# Patient Record
Sex: Male | Born: 1975 | Race: White | Hispanic: No | Marital: Married | Smoking: Current every day smoker
Health system: Southern US, Community
[De-identification: ages and names within clinical notes are randomized; demographics above are authoritative.]

## PROBLEM LIST (undated history)

## (undated) DIAGNOSIS — F419 Anxiety disorder, unspecified: Secondary | ICD-10-CM

## (undated) DIAGNOSIS — F1010 Alcohol abuse, uncomplicated: Secondary | ICD-10-CM

## (undated) DIAGNOSIS — Z87898 Personal history of other specified conditions: Secondary | ICD-10-CM

## (undated) DIAGNOSIS — I10 Essential (primary) hypertension: Secondary | ICD-10-CM

## (undated) HISTORY — PX: HERNIA REPAIR: SHX51

---

## 2018-05-15 DEATH — deceased

## 2019-04-12 ENCOUNTER — Other Ambulatory Visit: Payer: Self-pay | Admitting: *Deleted

## 2019-04-12 DIAGNOSIS — Z20822 Contact with and (suspected) exposure to covid-19: Secondary | ICD-10-CM

## 2019-04-13 ENCOUNTER — Telehealth: Payer: Self-pay

## 2019-04-13 LAB — NOVEL CORONAVIRUS, NAA: SARS-CoV-2, NAA: DETECTED — AB

## 2019-04-13 NOTE — Telephone Encounter (Addendum)
Patient called in requesting MyChart setup assistance and COVID19 lab results - DOB/Address verified  - assisted w/MyChart setup and Positive results given.   Patient reports having diarrhea  - x6 today; a dry cough,fever; runny nose - clear and sore throat. Patient reports taking OTC Ibuprofen 200 mg  x2 for fever. Recommended OTC Immodium, Kaopectate or Pepto Bismol - as directed for diarrhea; drinking warm fluids for dry cough and sore throat.  Reviewed Positive protocol with patient and advised to notify PCP of positive results, no further questions.

## 2019-04-18 ENCOUNTER — Encounter (HOSPITAL_COMMUNITY): Payer: Self-pay | Admitting: Emergency Medicine

## 2019-04-18 ENCOUNTER — Emergency Department (HOSPITAL_COMMUNITY)
Admission: EM | Admit: 2019-04-18 | Discharge: 2019-04-19 | Disposition: A | Discharge status: Home / Self Care | Payer: Self-pay | Attending: Emergency Medicine | Admitting: Emergency Medicine

## 2019-04-18 ENCOUNTER — Emergency Department (HOSPITAL_COMMUNITY): Payer: Self-pay

## 2019-04-18 ENCOUNTER — Other Ambulatory Visit: Payer: Self-pay

## 2019-04-18 DIAGNOSIS — Y939 Activity, unspecified: Secondary | ICD-10-CM | POA: Insufficient documentation

## 2019-04-18 DIAGNOSIS — Y929 Unspecified place or not applicable: Secondary | ICD-10-CM | POA: Insufficient documentation

## 2019-04-18 DIAGNOSIS — Y999 Unspecified external cause status: Secondary | ICD-10-CM | POA: Insufficient documentation

## 2019-04-18 DIAGNOSIS — F17210 Nicotine dependence, cigarettes, uncomplicated: Secondary | ICD-10-CM | POA: Insufficient documentation

## 2019-04-18 DIAGNOSIS — S01111A Laceration without foreign body of right eyelid and periocular area, initial encounter: Secondary | ICD-10-CM | POA: Insufficient documentation

## 2019-04-18 MED ORDER — ACETAMINOPHEN 325 MG PO TABS
650.0000 mg | ORAL_TABLET | Freq: Once | ORAL | Status: AC
  Administered 2019-04-18: 650 mg via ORAL
  Filled 2019-04-18: qty 2
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
## 2019-04-18 NOTE — ED Notes (Signed)
Patient transported to CT 

## 2019-04-19 MED ORDER — IBUPROFEN 800 MG PO TABS: 800.0000 mg | ORAL_TABLET | Freq: Three times a day (TID) | ORAL | 0 refills | Status: AC
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
## 2019-04-19 NOTE — Discharge Instructions (Signed)
Apply ice packs on and off to your eye to help reduce bruising and swelling.  The Dermabond used to close the laceration will begin to peel off in a week or so.  Allow this to come off naturally.  Return to the ER for any worsening symptoms.  You may follow-up with the eye doctor listed if needed.
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
## 2023-10-25 ENCOUNTER — Encounter: Payer: Self-pay | Admitting: Internal Medicine

## 2023-11-24 ENCOUNTER — Emergency Department (HOSPITAL_COMMUNITY)

## 2023-11-24 ENCOUNTER — Encounter (HOSPITAL_COMMUNITY): Payer: Self-pay

## 2023-11-24 ENCOUNTER — Telehealth: Payer: Self-pay | Admitting: *Deleted

## 2023-11-24 ENCOUNTER — Inpatient Hospital Stay (HOSPITAL_COMMUNITY)
Admission: EM | Admit: 2023-11-24 | Discharge: 2023-11-27 | DRG: 418 | Disposition: A | Discharge status: Home / Self Care | Source: Ambulatory Visit | Attending: Internal Medicine | Admitting: Internal Medicine

## 2023-11-24 ENCOUNTER — Encounter: Payer: Self-pay | Admitting: Internal Medicine

## 2023-11-24 ENCOUNTER — Other Ambulatory Visit: Payer: Self-pay

## 2023-11-24 ENCOUNTER — Ambulatory Visit (INDEPENDENT_AMBULATORY_CARE_PROVIDER_SITE_OTHER): Admitting: Internal Medicine

## 2023-11-24 VITALS — BP 157/103 | HR 83 | Temp 97.8°F | Ht 72.0 in | Wt 194.3 lb

## 2023-11-24 DIAGNOSIS — R112 Nausea with vomiting, unspecified: Secondary | ICD-10-CM | POA: Diagnosis not present

## 2023-11-24 DIAGNOSIS — K810 Acute cholecystitis: Secondary | ICD-10-CM

## 2023-11-24 DIAGNOSIS — F10939 Alcohol use, unspecified with withdrawal, unspecified: Secondary | ICD-10-CM | POA: Insufficient documentation

## 2023-11-24 DIAGNOSIS — R630 Anorexia: Secondary | ICD-10-CM | POA: Diagnosis not present

## 2023-11-24 DIAGNOSIS — K824 Cholesterolosis of gallbladder: Secondary | ICD-10-CM | POA: Diagnosis not present

## 2023-11-24 DIAGNOSIS — I10 Essential (primary) hypertension: Secondary | ICD-10-CM | POA: Diagnosis present

## 2023-11-24 DIAGNOSIS — F1010 Alcohol abuse, uncomplicated: Secondary | ICD-10-CM | POA: Insufficient documentation

## 2023-11-24 DIAGNOSIS — K811 Chronic cholecystitis: Secondary | ICD-10-CM | POA: Diagnosis present

## 2023-11-24 DIAGNOSIS — K660 Peritoneal adhesions (postprocedural) (postinfection): Secondary | ICD-10-CM | POA: Diagnosis present

## 2023-11-24 DIAGNOSIS — F10239 Alcohol dependence with withdrawal, unspecified: Secondary | ICD-10-CM | POA: Diagnosis present

## 2023-11-24 DIAGNOSIS — K7581 Nonalcoholic steatohepatitis (NASH): Secondary | ICD-10-CM | POA: Diagnosis present

## 2023-11-24 DIAGNOSIS — R1033 Periumbilical pain: Secondary | ICD-10-CM | POA: Diagnosis not present

## 2023-11-24 DIAGNOSIS — F1029 Alcohol dependence with unspecified alcohol-induced disorder: Secondary | ICD-10-CM

## 2023-11-24 DIAGNOSIS — R1114 Bilious vomiting: Secondary | ICD-10-CM

## 2023-11-24 DIAGNOSIS — F418 Other specified anxiety disorders: Secondary | ICD-10-CM | POA: Diagnosis not present

## 2023-11-24 DIAGNOSIS — F10151 Alcohol abuse with alcohol-induced psychotic disorder with hallucinations: Secondary | ICD-10-CM | POA: Diagnosis not present

## 2023-11-24 DIAGNOSIS — R1011 Right upper quadrant pain: Secondary | ICD-10-CM

## 2023-11-24 DIAGNOSIS — R1110 Vomiting, unspecified: Secondary | ICD-10-CM | POA: Diagnosis not present

## 2023-11-24 DIAGNOSIS — F419 Anxiety disorder, unspecified: Secondary | ICD-10-CM | POA: Diagnosis present

## 2023-11-24 DIAGNOSIS — Z79899 Other long term (current) drug therapy: Secondary | ICD-10-CM | POA: Diagnosis not present

## 2023-11-24 DIAGNOSIS — K4090 Unilateral inguinal hernia, without obstruction or gangrene, not specified as recurrent: Secondary | ICD-10-CM | POA: Diagnosis present

## 2023-11-24 DIAGNOSIS — F32A Depression, unspecified: Secondary | ICD-10-CM | POA: Diagnosis present

## 2023-11-24 DIAGNOSIS — K760 Fatty (change of) liver, not elsewhere classified: Secondary | ICD-10-CM

## 2023-11-24 DIAGNOSIS — F17210 Nicotine dependence, cigarettes, uncomplicated: Secondary | ICD-10-CM | POA: Diagnosis present

## 2023-11-24 DIAGNOSIS — R7401 Elevation of levels of liver transaminase levels: Secondary | ICD-10-CM | POA: Diagnosis not present

## 2023-11-24 DIAGNOSIS — R7989 Other specified abnormal findings of blood chemistry: Secondary | ICD-10-CM | POA: Diagnosis not present

## 2023-11-24 DIAGNOSIS — R410 Disorientation, unspecified: Secondary | ICD-10-CM | POA: Diagnosis not present

## 2023-11-24 DIAGNOSIS — F10139 Alcohol abuse with withdrawal, unspecified: Secondary | ICD-10-CM | POA: Diagnosis not present

## 2023-11-24 DIAGNOSIS — F10930 Alcohol use, unspecified with withdrawal, uncomplicated: Secondary | ICD-10-CM | POA: Diagnosis not present

## 2023-11-24 DIAGNOSIS — K420 Umbilical hernia with obstruction, without gangrene: Secondary | ICD-10-CM | POA: Diagnosis present

## 2023-11-24 DIAGNOSIS — G8929 Other chronic pain: Secondary | ICD-10-CM | POA: Diagnosis present

## 2023-11-24 DIAGNOSIS — F1120 Opioid dependence, uncomplicated: Secondary | ICD-10-CM | POA: Diagnosis present

## 2023-11-24 DIAGNOSIS — G8918 Other acute postprocedural pain: Secondary | ICD-10-CM | POA: Diagnosis present

## 2023-11-24 DIAGNOSIS — F1090 Alcohol use, unspecified, uncomplicated: Secondary | ICD-10-CM

## 2023-11-24 DIAGNOSIS — E46 Unspecified protein-calorie malnutrition: Secondary | ICD-10-CM | POA: Diagnosis not present

## 2023-11-24 DIAGNOSIS — K429 Umbilical hernia without obstruction or gangrene: Secondary | ICD-10-CM

## 2023-11-24 DIAGNOSIS — K439 Ventral hernia without obstruction or gangrene: Principal | ICD-10-CM

## 2023-11-24 DIAGNOSIS — E8809 Other disorders of plasma-protein metabolism, not elsewhere classified: Secondary | ICD-10-CM | POA: Diagnosis not present

## 2023-11-24 DIAGNOSIS — Z720 Tobacco use: Secondary | ICD-10-CM | POA: Diagnosis not present

## 2023-11-24 HISTORY — DX: Personal history of other specified conditions: Z87.898

## 2023-11-24 HISTORY — DX: Essential (primary) hypertension: I10

## 2023-11-24 HISTORY — DX: Anxiety disorder, unspecified: F41.9

## 2023-11-24 HISTORY — DX: Alcohol abuse, uncomplicated: F10.10

## 2023-11-24 LAB — URINALYSIS, ROUTINE W REFLEX MICROSCOPIC
Bacteria, UA: NONE SEEN
Bilirubin Urine: NEGATIVE
Glucose, UA: 50 mg/dL — AB
Hgb urine dipstick: NEGATIVE
Ketones, ur: NEGATIVE mg/dL
Leukocytes,Ua: NEGATIVE
Nitrite: NEGATIVE
Protein, ur: 30 mg/dL — AB
Specific Gravity, Urine: 1.02 (ref 1.005–1.030)
pH: 6 (ref 5.0–8.0)

## 2023-11-24 LAB — COMPREHENSIVE METABOLIC PANEL WITH GFR
ALT: 98 U/L — ABNORMAL HIGH (ref 0–44)
AST: 120 U/L — ABNORMAL HIGH (ref 15–41)
Albumin: 3.2 g/dL — ABNORMAL LOW (ref 3.5–5.0)
Alkaline Phosphatase: 102 U/L (ref 38–126)
Anion gap: 12 (ref 5–15)
BUN: 6 mg/dL (ref 6–20)
CO2: 23 mmol/L (ref 22–32)
Calcium: 8.5 mg/dL — ABNORMAL LOW (ref 8.9–10.3)
Chloride: 103 mmol/L (ref 98–111)
Creatinine, Ser: 0.79 mg/dL (ref 0.61–1.24)
GFR, Estimated: >60 mL/min (ref >60)
Glucose, Bld: 145 mg/dL — ABNORMAL HIGH (ref 70–99)
Potassium: 3.7 mmol/L (ref 3.5–5.1)
Sodium: 138 mmol/L (ref 135–145)
Total Bilirubin: 0.5 mg/dL (ref 0.0–1.2)
Total Protein: 6.4 g/dL — ABNORMAL LOW (ref 6.5–8.1)

## 2023-11-24 LAB — CBC
HCT: 43 % (ref 39.0–52.0)
Hemoglobin: 14.7 g/dL (ref 13.0–17.0)
MCH: 31.2 pg (ref 26.0–34.0)
MCHC: 34.2 g/dL (ref 30.0–36.0)
MCV: 91.3 fL (ref 80.0–100.0)
Platelets: 315 10*3/uL (ref 150–400)
RBC: 4.71 MIL/uL (ref 4.22–5.81)
RDW: 12.2 % (ref 11.5–15.5)
WBC: 4.5 10*3/uL (ref 4.0–10.5)
nRBC: 0 % (ref 0.0–0.2)

## 2023-11-24 LAB — PROTIME-INR
INR: 0.9 (ref 0.8–1.2)
Prothrombin Time: 12.6 s (ref 11.4–15.2)

## 2023-11-24 LAB — LIPASE, BLOOD: Lipase: 42 U/L (ref 11–51)

## 2023-11-24 LAB — SURGICAL PCR SCREEN
MRSA, PCR: NEGATIVE
Staphylococcus aureus: NEGATIVE

## 2023-11-24 MED ORDER — ONDANSETRON HCL 4 MG/2ML IJ SOLN: 4.0000 mg | Freq: Four times a day (QID) | INTRAMUSCULAR | Status: DC | PRN

## 2023-11-24 MED ORDER — SODIUM CHLORIDE 0.9 % IV SOLN
3.0000 g | Freq: Once | INTRAVENOUS | Status: AC
  Administered 2023-11-24: 3 g via INTRAVENOUS
  Filled 2023-11-24: qty 8

## 2023-11-24 MED ORDER — IOHEXOL 300 MG/ML  SOLN
100.0000 mL | Freq: Once | INTRAMUSCULAR | Status: AC | PRN
  Administered 2023-11-24: 100 mL via INTRAVENOUS

## 2023-11-24 MED ORDER — CHLORHEXIDINE GLUCONATE CLOTH 2 % EX PADS: 6.0000 | MEDICATED_PAD | Freq: Once | CUTANEOUS | Status: AC

## 2023-11-24 MED ORDER — FOLIC ACID 1 MG PO TABS
1.0000 mg | ORAL_TABLET | Freq: Every day | ORAL | Status: DC
  Administered 2023-11-24 – 2023-11-25 (×2): 1 mg via ORAL
  Filled 2023-11-24 (×4): qty 1

## 2023-11-24 MED ORDER — THIAMINE HCL 100 MG/ML IJ SOLN
100.0000 mg | Freq: Every day | INTRAMUSCULAR | Status: DC
  Administered 2023-11-24: 100 mg via INTRAVENOUS
  Filled 2023-11-24: qty 2

## 2023-11-24 MED ORDER — BUSPIRONE HCL 5 MG PO TABS
10.0000 mg | ORAL_TABLET | Freq: Three times a day (TID) | ORAL | Status: DC
  Administered 2023-11-24 – 2023-11-25 (×5): 10 mg via ORAL
  Filled 2023-11-24 (×9): qty 2

## 2023-11-24 MED ORDER — CHLORHEXIDINE GLUCONATE CLOTH 2 % EX PADS
6.0000 | MEDICATED_PAD | Freq: Once | CUTANEOUS | Status: AC
  Administered 2023-11-24: 6 via TOPICAL

## 2023-11-24 MED ORDER — LORAZEPAM 1 MG PO TABS
1.0000 mg | ORAL_TABLET | ORAL | Status: AC | PRN
  Administered 2023-11-24 (×2): 1 mg via ORAL
  Administered 2023-11-25: 2 mg via ORAL
  Filled 2023-11-24: qty 1
  Filled 2023-11-24: qty 2
  Filled 2023-11-24 (×2): qty 1

## 2023-11-24 MED ORDER — HEPARIN SODIUM (PORCINE) 5000 UNIT/ML IJ SOLN
5000.0000 [IU] | Freq: Three times a day (TID) | INTRAMUSCULAR | Status: DC
  Administered 2023-11-24 – 2023-11-25 (×3): 5000 [IU] via SUBCUTANEOUS
  Filled 2023-11-24 (×7): qty 1

## 2023-11-24 MED ORDER — BUPRENORPHINE HCL 8 MG SL SUBL
8.0000 mg | SUBLINGUAL_TABLET | Freq: Every day | SUBLINGUAL | Status: DC
  Administered 2023-11-24 – 2023-11-25 (×2): 8 mg via SUBLINGUAL
  Filled 2023-11-24: qty 4
  Filled 2023-11-24 (×2): qty 1
  Filled 2023-11-24: qty 4

## 2023-11-24 MED ORDER — GABAPENTIN 300 MG PO CAPS
300.0000 mg | ORAL_CAPSULE | Freq: Two times a day (BID) | ORAL | Status: DC
  Administered 2023-11-24 – 2023-11-25 (×3): 300 mg via ORAL
  Filled 2023-11-24 (×6): qty 1

## 2023-11-24 MED ORDER — POTASSIUM CHLORIDE IN NACL 20-0.9 MEQ/L-% IV SOLN: INTRAVENOUS | Status: AC

## 2023-11-24 MED ORDER — DIVALPROEX SODIUM 250 MG PO DR TAB
500.0000 mg | DELAYED_RELEASE_TABLET | Freq: Every day | ORAL | Status: DC
  Filled 2023-11-24 (×2): qty 2

## 2023-11-24 MED ORDER — LORAZEPAM 2 MG/ML IJ SOLN
1.0000 mg | INTRAMUSCULAR | Status: AC | PRN
  Administered 2023-11-24: 1 mg via INTRAVENOUS
  Administered 2023-11-25 (×4): 2 mg via INTRAVENOUS
  Administered 2023-11-25: 1 mg via INTRAVENOUS
  Filled 2023-11-24 (×7): qty 1

## 2023-11-24 MED ORDER — CLONIDINE HCL 0.1 MG PO TABS
0.1000 mg | ORAL_TABLET | Freq: Every day | ORAL | Status: DC
  Administered 2023-11-24 – 2023-11-25 (×2): 0.1 mg via ORAL
  Filled 2023-11-24 (×4): qty 1

## 2023-11-24 MED ORDER — FENTANYL CITRATE PF 50 MCG/ML IJ SOSY
50.0000 ug | PREFILLED_SYRINGE | Freq: Once | INTRAMUSCULAR | Status: AC
  Administered 2023-11-24: 50 ug via INTRAVENOUS
  Filled 2023-11-24: qty 1

## 2023-11-24 MED ORDER — HYDROXYZINE HCL 25 MG PO TABS
25.0000 mg | ORAL_TABLET | Freq: Three times a day (TID) | ORAL | Status: DC | PRN
  Administered 2023-11-24 – 2023-11-25 (×2): 25 mg via ORAL
  Filled 2023-11-24 (×3): qty 1

## 2023-11-24 MED ORDER — INDOCYANINE GREEN 25 MG IV SOLR
2.5000 mg | Freq: Once | INTRAVENOUS | Status: AC
  Administered 2023-11-25: 2.5 mg via INTRAVENOUS
  Filled 2023-11-24: qty 10

## 2023-11-24 MED ORDER — ONDANSETRON HCL 4 MG/2ML IJ SOLN
4.0000 mg | Freq: Once | INTRAMUSCULAR | Status: AC | PRN
  Administered 2023-11-24: 4 mg via INTRAVENOUS
  Filled 2023-11-24: qty 2

## 2023-11-24 MED ORDER — PANTOPRAZOLE SODIUM 40 MG PO TBEC
80.0000 mg | DELAYED_RELEASE_TABLET | Freq: Every day | ORAL | Status: DC
  Filled 2023-11-24: qty 2

## 2023-11-24 MED ORDER — FENTANYL CITRATE PF 50 MCG/ML IJ SOSY
50.0000 ug | PREFILLED_SYRINGE | INTRAMUSCULAR | Status: DC | PRN
  Administered 2023-11-24 – 2023-11-25 (×2): 50 ug via INTRAMUSCULAR
  Filled 2023-11-24 (×3): qty 1

## 2023-11-24 MED ORDER — THIAMINE MONONITRATE 100 MG PO TABS
100.0000 mg | ORAL_TABLET | Freq: Every day | ORAL | Status: DC
  Administered 2023-11-25: 100 mg via ORAL
  Filled 2023-11-24 (×3): qty 1

## 2023-11-24 MED ORDER — ONDANSETRON HCL 4 MG PO TABS: 4.0000 mg | ORAL_TABLET | Freq: Four times a day (QID) | ORAL | Status: DC | PRN

## 2023-11-24 MED ORDER — LACTATED RINGERS IV BOLUS
1000.0000 mL | Freq: Once | INTRAVENOUS | Status: AC
  Administered 2023-11-24: 1000 mL via INTRAVENOUS

## 2023-11-24 MED ORDER — SODIUM CHLORIDE 0.9 % IV SOLN
2.0000 g | INTRAVENOUS | Status: AC
  Administered 2023-11-25: 2 g via INTRAVENOUS
  Filled 2023-11-24 (×2): qty 2

## 2023-11-24 MED ORDER — MUPIROCIN 2 % EX OINT
1.0000 | TOPICAL_OINTMENT | Freq: Two times a day (BID) | CUTANEOUS | Status: DC
  Filled 2023-11-24: qty 22

## 2023-11-24 MED ORDER — ADULT MULTIVITAMIN W/MINERALS CH
1.0000 | ORAL_TABLET | Freq: Every day | ORAL | Status: DC
  Administered 2023-11-24 – 2023-11-25 (×2): 1 via ORAL
  Filled 2023-11-24 (×5): qty 1

## 2023-11-24 NOTE — ED Notes (Signed)
Pt reminded of urine sample needed.

## 2023-11-24 NOTE — ED Triage Notes (Signed)
 Pt arrived POV with c/o abd pain, nausea and decreased appetite for awhile. Pt went to GI Dr and he sent pt over to ED for evaluation and possible gallbladder removal.

## 2023-11-24 NOTE — H&P (View-Only) (Signed)
 [facility] Consult  Reason for Consult: RUQ pain, fatty liver  disease  Referring Physician: Dr. Mordechai April, Pearletha Bouche, PA   Chief Complaint   Abdominal Pain; Nausea     HPI: Eddie Bailey is a 48 y.o. male with chronic RUQ pain that has been going on for 3 months with an extensive workup at [facility] with CT and US  and HIDA. He had been noted to have a distended gallbladder, Murphy's sign but no obvious stones, normal gallbladder function, and fatty liver. The CT demonstrated no concern for hernia.   He was seen by Dr. Mordechai April today and told to go to the ED due to how much pain he was having in the RUQ.   The patient has a history of opioid abuse and is seen by Dr. Ambrosio Junker in [place] and takes buprenorphine/naloxone sublinguinal medication everyday for this in addition to clonidine, Depakote, and atarax for anxiety he reports. He has not been taking all of his medication due to his nausea/vomiting. He has been taking his buprenorphine/ naloxone sublinguinal.  He is worried about taking any opioids in the hospital.  He says his pain has been constant in the RUQ. He was worked up in the ED with gallbladder polyp on US , distended gallbladder, fatty liver, and CBD that was 5mm. He had elevated AST/ALT and normal bilirubin.   I was asked to see him about cholecystectomy.   Past Medical History:  Diagnosis Date   Alcohol abuse    10 years   Anxiety    History of substance use    Hypertension     Past Surgical History:  Procedure Laterality Date   HERNIA REPAIR     inguinal    History reviewed. No pertinent family history.  Social History   Tobacco Use   Smoking status: Every Day    Current packs/day: 1.00    Types: Cigarettes   Smokeless tobacco: Never  Vaping Use   Vaping status: Never Used  Substance Use Topics   Alcohol use: Yes    Alcohol/week: 12.0 standard drinks of alcohol    Types: 12 Cans of beer per week    Comment: daily   Drug use:  Not Currently    Medications: I have reviewed the patient's current medications. Current Facility-Administered Medications  Medication Dose Route Frequency Provider Last Rate Last Admin   Ampicillin-Sulbactam (UNASYN) 3 g in sodium chloride 0.9 % 100 mL IVPB  3 g Intravenous Once Rigney, Christopher D, PA-C       folic acid (FOLVITE) tablet 1 mg  1 mg Oral Daily Rigney, Christopher D, PA-C       LORazepam (ATIVAN) tablet 1-4 mg  1-4 mg Oral Q1H PRN Roselynn Connors, PA-C       Or   LORazepam (ATIVAN) injection 1-4 mg  1-4 mg Intravenous Q1H PRN Pearletha Bouche D, PA-C       multivitamin with minerals tablet 1 tablet  1 tablet Oral Daily Rigney, Christopher D, PA-C       thiamine (VITAMIN B1) tablet 100 mg  100 mg Oral Daily Rigney, Christopher D, PA-C       Or   thiamine (VITAMIN B1) injection 100 mg  100 mg Intravenous Daily Roselynn Connors, PA-C       Current Outpatient Medications  Medication Sig Dispense Refill Last Dose/Taking   Buprenorphine HCl-Naloxone HCl 8-2 MG FILM Place 1 Film under the tongue daily at 6 (six) AM.   11/24/2023 Morning   busPIRone (  BUSPAR) 10 MG tablet Take 10 mg by mouth 3 (three) times daily.   Past Month   cloNIDine (CATAPRES) 0.1 MG tablet Take 0.1 mg by mouth daily.   11/23/2023 Morning   divalproex (DEPAKOTE) 500 MG DR tablet Take 500 mg by mouth daily at 6 (six) AM.   Past Month   esomeprazole (NEXIUM) 40 MG capsule Take 40 mg by mouth every morning.   Past Month   famotidine (PEPCID) 40 MG tablet Take 40 mg by mouth daily.   Past Month   gabapentin (NEURONTIN) 300 MG capsule Take 300 mg by mouth 2 (two) times daily.   Past Month   hydrOXYzine (ATARAX) 50 MG tablet Take 25 mg by mouth 3 (three) times daily as needed.   11/23/2023 Morning   ibuprofen  (ADVIL ) 800 MG tablet Take 1 tablet (800 mg total) by mouth 3 (three) times daily. 21 tablet 0 Taking   ondansetron (ZOFRAN-ODT) 8 MG disintegrating tablet Take 8 mg by mouth every 8 (eight) hours  as needed for vomiting or nausea.   11/24/2023 Morning   BRIXADI 128 MG/0.36ML SOSY Inject 0.36 mLs into the skin every 30 (thirty) days. (Patient not taking: Reported on 11/24/2023)   Not Taking   ondansetron (ZOFRAN-ODT) 4 MG disintegrating tablet Take 4 mg by mouth every 8 (eight) hours as needed. (Patient not taking: Reported on 11/24/2023)   Not Taking     No Known Allergies   ROS:  A comprehensive review of systems was negative except for: Gastrointestinal: positive for abdominal pain, nausea, and vomiting  Blood pressure (!) 157/102, pulse 80, temperature 98.7 F (37.1 C), temperature source Oral, resp. rate 13, SpO2 94%. Physical Exam Vitals reviewed.  HENT:     Head: Normocephalic.  Eyes:     Extraocular Movements: Extraocular movements intact.  Cardiovascular:     Rate and Rhythm: Normal rate.  Pulmonary:     Effort: Pulmonary effort is normal.  Abdominal:     General: There is no distension.     Palpations: Abdomen is soft.     Tenderness: There is abdominal tenderness in the right upper quadrant and periumbilical area.     Hernia: A hernia is present. Hernia is present in the umbilical area.     Comments: Incarcerated umbilical hernia with pain, some skin changes with redness inferiorly   Skin:    General: Skin is warm.  Neurological:     General: No focal deficit present.     Mental Status: He is alert.  Psychiatric:        Mood and Affect: Mood normal.        Behavior: Behavior normal.     Results: Results for orders placed or performed during the hospital encounter of 11/24/23 (from the past 48 hours)  Urinalysis, Routine w reflex microscopic -Urine, Clean Catch     Status: Abnormal   Collection Time: 11/24/23 11:00 AM  Result Value Ref Range   Color, Urine YELLOW YELLOW   APPearance CLEAR CLEAR   Specific Gravity, Urine 1.020 1.005 - 1.030   pH 6.0 5.0 - 8.0   Glucose, UA 50 (A) NEGATIVE mg/dL   Hgb urine dipstick NEGATIVE NEGATIVE   Bilirubin Urine  NEGATIVE NEGATIVE   Ketones, ur NEGATIVE NEGATIVE mg/dL   Protein, ur 30 (A) NEGATIVE mg/dL   Nitrite NEGATIVE NEGATIVE   Leukocytes,Ua NEGATIVE NEGATIVE   RBC / HPF 0-5 0 - 5 RBC/hpf   WBC, UA 0-5 0 - 5 WBC/hpf   Bacteria, UA  NONE SEEN NONE SEEN   Squamous Epithelial / HPF 0-5 0 - 5 /HPF   Mucus PRESENT     Comment: Performed at [facility], [address], [place], [place] [address]  Lipase, blood     Status: None   Collection Time: 11/24/23 11:13 AM  Result Value Ref Range   Lipase 42 11 - 51 U/L    Comment: Performed at [facility], [address] [place], [place] [address]  Comprehensive metabolic panel     Status: Abnormal   Collection Time: 11/24/23 11:13 AM  Result Value Ref Range   Sodium 138 135 - 145 mmol/L   Potassium 3.7 3.5 - 5.1 mmol/L   Chloride 103 98 - 111 mmol/L   CO2 23 22 - 32 mmol/L   Glucose, Bld 145 (H) 70 - 99 mg/dL    Comment: Glucose reference range applies only to samples taken after fasting for at least 8 hours.   BUN 6 6 - 20 mg/dL   Creatinine, Ser 7.25 0.61 - 1.24 mg/dL   Calcium 8.5 (L) 8.9 - 10.3 mg/dL   Total Protein 6.4 (L) 6.5 - 8.1 g/dL   Albumin 3.2 (L) 3.5 - 5.0 g/dL   AST 366 (H) 15 - 41 U/L   ALT 98 (H) 0 - 44 U/L   Alkaline Phosphatase 102 38 - 126 U/L   Total Bilirubin 0.5 0.0 - 1.2 mg/dL   GFR, Estimated >44 >03 mL/min    Comment: (NOTE) Calculated using the CKD-EPI Creatinine Equation (2021)    Anion gap 12 5 - 15    Comment: Performed at [facility], [address], [place], [place] [address]  CBC     Status: None   Collection Time: 11/24/23 11:13 AM  Result Value Ref Range   WBC 4.5 4.0 - 10.5 K/uL   RBC 4.71 4.22 - 5.81 MIL/uL   Hemoglobin 14.7 13.0 - 17.0 g/dL   HCT 95.6 38.7 - 56.4 %   MCV 91.3 80.0 - 100.0 fL   MCH 31.2 26.0 - 34.0 pg   MCHC 34.2 30.0 - 36.0 g/dL   RDW 33.2 95.1 - 88.4 %   Platelets 315 150 - 400 K/uL   nRBC 0.0 0.0 - 0.2 %    Comment: Performed at [facility], [address], [place],  [place] [address]  Protime-INR     Status: None   Collection Time: 11/24/23 12:09 PM  Result Value Ref Range   Prothrombin Time 12.6 11.4 - 15.2 seconds   INR 0.9 0.8 - 1.2    Comment: (NOTE) INR goal varies based on device and disease states. Performed at [facility], [address], [place], [place] [address]     CT ABDOMEN PELVIS W CONTRAST Result Date: 11/24/2023 CLINICAL DATA:  Abdominal wall hernia. EXAM: CT ABDOMEN AND PELVIS WITH CONTRAST TECHNIQUE: Multidetector CT imaging of the abdomen and pelvis was performed using the standard protocol following bolus administration of intravenous contrast. RADIATION DOSE REDUCTION: This exam was performed according to the departmental dose-optimization program which includes automated exposure control, adjustment of the mA and/or kV according to patient size and/or use of iterative reconstruction technique. CONTRAST:  100mL OMNIPAQUE IOHEXOL 300 MG/ML  SOLN COMPARISON:  None Available. FINDINGS: Lower chest: No acute abnormality. Hepatobiliary: No focal liver abnormality is seen. No gallstones, gallbladder wall thickening, or biliary dilatation. Pancreas: Unremarkable. No pancreatic ductal dilatation or surrounding inflammatory changes. Spleen: Normal in size without focal abnormality. Adrenals/Urinary Tract: Adrenal glands are unremarkable.  Kidneys are normal, without renal calculi, focal lesion, or hydronephrosis. Bladder is unremarkable. Stomach/Bowel: Stomach is within normal limits. Appendix appears normal. No evidence of bowel wall thickening, distention, or inflammatory changes. Vascular/Lymphatic: No significant vascular findings are present. No enlarged abdominal or pelvic lymph nodes. Reproductive: Prostate is unremarkable. Other: Small fat containing left inguinal hernia. Small periumbilical fat containing hernia. No ascites. Musculoskeletal: No acute or significant osseous findings. IMPRESSION: Small fat containing left inguinal hernia. Small fat  containing periumbilical hernia. No other abnormality seen in the abdomen or pelvis. Electronically Signed   By: Rosalene Colon M.D.   On: 11/24/2023 14:24   US  Abdomen Limited RUQ (LIVER/GB) Result Date: 11/24/2023 CLINICAL DATA:  Right upper quadrant pain EXAM: ULTRASOUND ABDOMEN LIMITED RIGHT UPPER QUADRANT COMPARISON:  None Available. FINDINGS: Gallbladder: Gallbladder is mildly distended. There is a nonshadowing echogenic rounded structure measuring 3 mm. Possible polyp. No shadowing stones. No wall thickening or adjacent fluid. The sonographer reports pain when scanning the gallbladder but the gallbladder is deep to the liver on these images. Common bile duct: Diameter: 5 mm Liver: Diffusely echogenic hepatic parenchyma consistent with fatty liver infiltration. Portal vein is patent on color Doppler imaging with normal direction of blood flow towards the liver. Other: None. IMPRESSION: Fatty liver infiltration. Distended gallbladder with a possible 3 mm polyp. No shadowing stones. The sonographer does report pain when scanning the gallbladder but difficult to confirm this is a Murphy's sign based on these images. Please correlate with clinical presentation. If needed additional workup as clinically appropriate such as HIDA scan. Electronically Signed   By: Adrianna Horde M.D.   On: 11/24/2023 11:57     Assessment & Plan:  Euell Schiff is a 48 y.o. male with what is likely chronic cholecystitis with intractable pain, nausea/vomiting that has been going on for 3 months. This ED visit he also has an incarcerated umbilical hernia he says has been getting larger and is more painful. His nausea has been getting worse. He drinks 6-12 twisted teas a day and has been drinking more due to his pain.  CT ordered to ensure no bowel in the hernia.   PLAN: I counseled the patient about the indication, risks and benefits of robotic assisted laparoscopic cholecystectomy.  He understands there is a very small chance for  bleeding, infection, injury to normal structures (including common bile duct), conversion to open surgery, persistent symptoms, evolution of postcholecystectomy diarrhea, need for secondary interventions, anesthesia reaction, cardiopulmonary issues and other risks not specifically detailed here. I described the expected recovery, the plan for follow-up and the restrictions during the recovery phase.  All questions were answered. Discussed adding on liver biopsy for his fatty liver potential fibrosis and repairing his umbilical hernia primarily as long as the CT scan showed no bowel in the hernia today. Discussed risk of recurrence of the hernia and need to not lift over 10 lbs for 6 weeks post op.   Discussed that his pain could be from his liver disease or ulcer but that with the location it was likely his gallbladder.   Discussed his alcohol use/abuse and risk of withdrawal. Discussed his opioid abuse in the past, need to treat pain now, and My office called Dr. Ambrosio Junker and she reports patient can receive whatever opioid needed inpatient and that he should be discharged to follow up with her for some buprenorphine (no naloxone) and then immediately contact her post op for further management of any pain. Discussed that with all these  risk that I will get the hospitalist help in admitting him to ensure nothing is overlooked and that his pain is managed and risk of withdrawal reduced.   Holding naloxone while inpatient.    All questions were answered to the satisfaction of the patient and family.    Awilda Bogus 11/24/2023, 2:29 PM

## 2023-11-24 NOTE — ED Notes (Signed)
 Last alcohol drink was 10am.

## 2023-11-24 NOTE — Consult Note (Addendum)
 [facility] Consult  Reason for Consult: RUQ pain, fatty liver  disease  Referring Physician: Dr. Mordechai April, Pearletha Bouche, PA   Chief Complaint   Abdominal Pain; Nausea     HPI: Jerauld Bostwick is a 48 y.o. male with chronic RUQ pain that has been going on for 3 months with an extensive workup at [facility] with CT and US  and HIDA. He had been noted to have a distended gallbladder, Murphy's sign but no obvious stones, normal gallbladder function, and fatty liver. The CT demonstrated no concern for hernia.   He was seen by Dr. Mordechai April today and told to go to the ED due to how much pain he was having in the RUQ.   The patient has a history of opioid abuse and is seen by Dr. Ambrosio Junker in [place] and takes buprenorphine/naloxone sublinguinal medication everyday for this in addition to clonidine, Depakote, and atarax for anxiety he reports. He has not been taking all of his medication due to his nausea/vomiting. He has been taking his buprenorphine/ naloxone sublinguinal.  He is worried about taking any opioids in the hospital.  He says his pain has been constant in the RUQ. He was worked up in the ED with gallbladder polyp on US , distended gallbladder, fatty liver, and CBD that was 5mm. He had elevated AST/ALT and normal bilirubin.   I was asked to see him about cholecystectomy.   Past Medical History:  Diagnosis Date   Alcohol abuse    10 years   Anxiety    History of substance use    Hypertension     Past Surgical History:  Procedure Laterality Date   HERNIA REPAIR     inguinal    History reviewed. No pertinent family history.  Social History   Tobacco Use   Smoking status: Every Day    Current packs/day: 1.00    Types: Cigarettes   Smokeless tobacco: Never  Vaping Use   Vaping status: Never Used  Substance Use Topics   Alcohol use: Yes    Alcohol/week: 12.0 standard drinks of alcohol    Types: 12 Cans of beer per week    Comment: daily   Drug use:  Not Currently    Medications: I have reviewed the patient's current medications. Current Facility-Administered Medications  Medication Dose Route Frequency Provider Last Rate Last Admin   Ampicillin-Sulbactam (UNASYN) 3 g in sodium chloride 0.9 % 100 mL IVPB  3 g Intravenous Once Rigney, Christopher D, PA-C       folic acid (FOLVITE) tablet 1 mg  1 mg Oral Daily Rigney, Christopher D, PA-C       LORazepam (ATIVAN) tablet 1-4 mg  1-4 mg Oral Q1H PRN Roselynn Connors, PA-C       Or   LORazepam (ATIVAN) injection 1-4 mg  1-4 mg Intravenous Q1H PRN Pearletha Bouche D, PA-C       multivitamin with minerals tablet 1 tablet  1 tablet Oral Daily Rigney, Christopher D, PA-C       thiamine (VITAMIN B1) tablet 100 mg  100 mg Oral Daily Rigney, Christopher D, PA-C       Or   thiamine (VITAMIN B1) injection 100 mg  100 mg Intravenous Daily Roselynn Connors, PA-C       Current Outpatient Medications  Medication Sig Dispense Refill Last Dose/Taking   Buprenorphine HCl-Naloxone HCl 8-2 MG FILM Place 1 Film under the tongue daily at 6 (six) AM.   11/24/2023 Morning   busPIRone (  BUSPAR) 10 MG tablet Take 10 mg by mouth 3 (three) times daily.   Past Month   cloNIDine (CATAPRES) 0.1 MG tablet Take 0.1 mg by mouth daily.   11/23/2023 Morning   divalproex (DEPAKOTE) 500 MG DR tablet Take 500 mg by mouth daily at 6 (six) AM.   Past Month   esomeprazole (NEXIUM) 40 MG capsule Take 40 mg by mouth every morning.   Past Month   famotidine (PEPCID) 40 MG tablet Take 40 mg by mouth daily.   Past Month   gabapentin (NEURONTIN) 300 MG capsule Take 300 mg by mouth 2 (two) times daily.   Past Month   hydrOXYzine (ATARAX) 50 MG tablet Take 25 mg by mouth 3 (three) times daily as needed.   11/23/2023 Morning   ibuprofen  (ADVIL ) 800 MG tablet Take 1 tablet (800 mg total) by mouth 3 (three) times daily. 21 tablet 0 Taking   ondansetron (ZOFRAN-ODT) 8 MG disintegrating tablet Take 8 mg by mouth every 8 (eight) hours  as needed for vomiting or nausea.   11/24/2023 Morning   BRIXADI 128 MG/0.36ML SOSY Inject 0.36 mLs into the skin every 30 (thirty) days. (Patient not taking: Reported on 11/24/2023)   Not Taking   ondansetron (ZOFRAN-ODT) 4 MG disintegrating tablet Take 4 mg by mouth every 8 (eight) hours as needed. (Patient not taking: Reported on 11/24/2023)   Not Taking     No Known Allergies   ROS:  A comprehensive review of systems was negative except for: Gastrointestinal: positive for abdominal pain, nausea, and vomiting  Blood pressure (!) 157/102, pulse 80, temperature 98.7 F (37.1 C), temperature source Oral, resp. rate 13, SpO2 94%. Physical Exam Vitals reviewed.  HENT:     Head: Normocephalic.  Eyes:     Extraocular Movements: Extraocular movements intact.  Cardiovascular:     Rate and Rhythm: Normal rate.  Pulmonary:     Effort: Pulmonary effort is normal.  Abdominal:     General: There is no distension.     Palpations: Abdomen is soft.     Tenderness: There is abdominal tenderness in the right upper quadrant and periumbilical area.     Hernia: A hernia is present. Hernia is present in the umbilical area.     Comments: Incarcerated umbilical hernia with pain, some skin changes with redness inferiorly   Skin:    General: Skin is warm.  Neurological:     General: No focal deficit present.     Mental Status: He is alert.  Psychiatric:        Mood and Affect: Mood normal.        Behavior: Behavior normal.     Results: Results for orders placed or performed during the hospital encounter of 11/24/23 (from the past 48 hours)  Urinalysis, Routine w reflex microscopic -Urine, Clean Catch     Status: Abnormal   Collection Time: 11/24/23 11:00 AM  Result Value Ref Range   Color, Urine YELLOW YELLOW   APPearance CLEAR CLEAR   Specific Gravity, Urine 1.020 1.005 - 1.030   pH 6.0 5.0 - 8.0   Glucose, UA 50 (A) NEGATIVE mg/dL   Hgb urine dipstick NEGATIVE NEGATIVE   Bilirubin Urine  NEGATIVE NEGATIVE   Ketones, ur NEGATIVE NEGATIVE mg/dL   Protein, ur 30 (A) NEGATIVE mg/dL   Nitrite NEGATIVE NEGATIVE   Leukocytes,Ua NEGATIVE NEGATIVE   RBC / HPF 0-5 0 - 5 RBC/hpf   WBC, UA 0-5 0 - 5 WBC/hpf   Bacteria, UA  NONE SEEN NONE SEEN   Squamous Epithelial / HPF 0-5 0 - 5 /HPF   Mucus PRESENT     Comment: Performed at [facility], [address], [place], [place] [address]  Lipase, blood     Status: None   Collection Time: 11/24/23 11:13 AM  Result Value Ref Range   Lipase 42 11 - 51 U/L    Comment: Performed at [facility], [address] [place], [place] [address]  Comprehensive metabolic panel     Status: Abnormal   Collection Time: 11/24/23 11:13 AM  Result Value Ref Range   Sodium 138 135 - 145 mmol/L   Potassium 3.7 3.5 - 5.1 mmol/L   Chloride 103 98 - 111 mmol/L   CO2 23 22 - 32 mmol/L   Glucose, Bld 145 (H) 70 - 99 mg/dL    Comment: Glucose reference range applies only to samples taken after fasting for at least 8 hours.   BUN 6 6 - 20 mg/dL   Creatinine, Ser 7.25 0.61 - 1.24 mg/dL   Calcium 8.5 (L) 8.9 - 10.3 mg/dL   Total Protein 6.4 (L) 6.5 - 8.1 g/dL   Albumin 3.2 (L) 3.5 - 5.0 g/dL   AST 366 (H) 15 - 41 U/L   ALT 98 (H) 0 - 44 U/L   Alkaline Phosphatase 102 38 - 126 U/L   Total Bilirubin 0.5 0.0 - 1.2 mg/dL   GFR, Estimated >44 >03 mL/min    Comment: (NOTE) Calculated using the CKD-EPI Creatinine Equation (2021)    Anion gap 12 5 - 15    Comment: Performed at [facility], [address], [place], [place] [address]  CBC     Status: None   Collection Time: 11/24/23 11:13 AM  Result Value Ref Range   WBC 4.5 4.0 - 10.5 K/uL   RBC 4.71 4.22 - 5.81 MIL/uL   Hemoglobin 14.7 13.0 - 17.0 g/dL   HCT 95.6 38.7 - 56.4 %   MCV 91.3 80.0 - 100.0 fL   MCH 31.2 26.0 - 34.0 pg   MCHC 34.2 30.0 - 36.0 g/dL   RDW 33.2 95.1 - 88.4 %   Platelets 315 150 - 400 K/uL   nRBC 0.0 0.0 - 0.2 %    Comment: Performed at [facility], [address], [place],  [place] [address]  Protime-INR     Status: None   Collection Time: 11/24/23 12:09 PM  Result Value Ref Range   Prothrombin Time 12.6 11.4 - 15.2 seconds   INR 0.9 0.8 - 1.2    Comment: (NOTE) INR goal varies based on device and disease states. Performed at [facility], [address], [place], [place] [address]     CT ABDOMEN PELVIS W CONTRAST Result Date: 11/24/2023 CLINICAL DATA:  Abdominal wall hernia. EXAM: CT ABDOMEN AND PELVIS WITH CONTRAST TECHNIQUE: Multidetector CT imaging of the abdomen and pelvis was performed using the standard protocol following bolus administration of intravenous contrast. RADIATION DOSE REDUCTION: This exam was performed according to the departmental dose-optimization program which includes automated exposure control, adjustment of the mA and/or kV according to patient size and/or use of iterative reconstruction technique. CONTRAST:  100mL OMNIPAQUE IOHEXOL 300 MG/ML  SOLN COMPARISON:  None Available. FINDINGS: Lower chest: No acute abnormality. Hepatobiliary: No focal liver abnormality is seen. No gallstones, gallbladder wall thickening, or biliary dilatation. Pancreas: Unremarkable. No pancreatic ductal dilatation or surrounding inflammatory changes. Spleen: Normal in size without focal abnormality. Adrenals/Urinary Tract: Adrenal glands are unremarkable.  Kidneys are normal, without renal calculi, focal lesion, or hydronephrosis. Bladder is unremarkable. Stomach/Bowel: Stomach is within normal limits. Appendix appears normal. No evidence of bowel wall thickening, distention, or inflammatory changes. Vascular/Lymphatic: No significant vascular findings are present. No enlarged abdominal or pelvic lymph nodes. Reproductive: Prostate is unremarkable. Other: Small fat containing left inguinal hernia. Small periumbilical fat containing hernia. No ascites. Musculoskeletal: No acute or significant osseous findings. IMPRESSION: Small fat containing left inguinal hernia. Small fat  containing periumbilical hernia. No other abnormality seen in the abdomen or pelvis. Electronically Signed   By: Rosalene Colon M.D.   On: 11/24/2023 14:24   US  Abdomen Limited RUQ (LIVER/GB) Result Date: 11/24/2023 CLINICAL DATA:  Right upper quadrant pain EXAM: ULTRASOUND ABDOMEN LIMITED RIGHT UPPER QUADRANT COMPARISON:  None Available. FINDINGS: Gallbladder: Gallbladder is mildly distended. There is a nonshadowing echogenic rounded structure measuring 3 mm. Possible polyp. No shadowing stones. No wall thickening or adjacent fluid. The sonographer reports pain when scanning the gallbladder but the gallbladder is deep to the liver on these images. Common bile duct: Diameter: 5 mm Liver: Diffusely echogenic hepatic parenchyma consistent with fatty liver infiltration. Portal vein is patent on color Doppler imaging with normal direction of blood flow towards the liver. Other: None. IMPRESSION: Fatty liver infiltration. Distended gallbladder with a possible 3 mm polyp. No shadowing stones. The sonographer does report pain when scanning the gallbladder but difficult to confirm this is a Murphy's sign based on these images. Please correlate with clinical presentation. If needed additional workup as clinically appropriate such as HIDA scan. Electronically Signed   By: Adrianna Horde M.D.   On: 11/24/2023 11:57     Assessment & Plan:  Euell Schiff is a 48 y.o. male with what is likely chronic cholecystitis with intractable pain, nausea/vomiting that has been going on for 3 months. This ED visit he also has an incarcerated umbilical hernia he says has been getting larger and is more painful. His nausea has been getting worse. He drinks 6-12 twisted teas a day and has been drinking more due to his pain.  CT ordered to ensure no bowel in the hernia.   PLAN: I counseled the patient about the indication, risks and benefits of robotic assisted laparoscopic cholecystectomy.  He understands there is a very small chance for  bleeding, infection, injury to normal structures (including common bile duct), conversion to open surgery, persistent symptoms, evolution of postcholecystectomy diarrhea, need for secondary interventions, anesthesia reaction, cardiopulmonary issues and other risks not specifically detailed here. I described the expected recovery, the plan for follow-up and the restrictions during the recovery phase.  All questions were answered. Discussed adding on liver biopsy for his fatty liver potential fibrosis and repairing his umbilical hernia primarily as long as the CT scan showed no bowel in the hernia today. Discussed risk of recurrence of the hernia and need to not lift over 10 lbs for 6 weeks post op.   Discussed that his pain could be from his liver disease or ulcer but that with the location it was likely his gallbladder.   Discussed his alcohol use/abuse and risk of withdrawal. Discussed his opioid abuse in the past, need to treat pain now, and My office called Dr. Ambrosio Junker and she reports patient can receive whatever opioid needed inpatient and that he should be discharged to follow up with her for some buprenorphine (no naloxone) and then immediately contact her post op for further management of any pain. Discussed that with all these  risk that I will get the hospitalist help in admitting him to ensure nothing is overlooked and that his pain is managed and risk of withdrawal reduced.   Holding naloxone while inpatient.    All questions were answered to the satisfaction of the patient and family.    Awilda Bogus 11/24/2023, 2:29 PM

## 2023-11-24 NOTE — ED Provider Notes (Signed)
 [place] EMERGENCY DEPARTMENT AT [facility] Provider Note   CSN: [id number] Arrival date & time: 11/24/23  1032     History  Chief Complaint  Patient presents with   Abdominal Pain   Nausea    Eddie Bailey is a 48 y.o. male.  Patient is a 48 year old male who presents to the emergency department from gastroenterology secondary to right-sided abdominal pain which has been ongoing for approximate the past 2 months.  He was noted to be point tender in the right upper quadrant by gastroenterology with concerns for acute cholecystitis.  Patient notes that he has had associated nausea and vomiting.  He admits to decreased appetite.  He has had no associated constipation or diarrhea.  Patient denies any associated fever, chills, chest pain, shortness of breath.   Abdominal Pain      Home Medications Prior to Admission medications   Medication Sig Start Date End Date Taking? Authorizing Provider  Buprenorphine HCl-Naloxone HCl 8-2 MG FILM Place under the tongue daily at 6 (six) AM. 11/04/23   [provider]  citalopram (CELEXA) 20 MG tablet Take 1 tablet by mouth daily. 08/16/23   [provider]  cloNIDine (CATAPRES) 0.1 MG tablet Take 0.1 mg by mouth daily. 08/16/23   [provider]  divalproex (DEPAKOTE) 500 MG DR tablet Take 500 mg by mouth daily at 6 (six) AM. 08/16/23   [provider]  esomeprazole (NEXIUM) 40 MG capsule Take 40 mg by mouth every morning. 09/21/23   [provider]  famotidine (PEPCID) 40 MG tablet Take 40 mg by mouth daily. 10/05/23   [provider]  gabapentin (NEURONTIN) 300 MG capsule Take 300 mg by mouth 2 (two) times daily. 08/16/23   [provider]  hydrOXYzine (ATARAX) 50 MG tablet Take 25 mg by mouth 3 (three) times daily as needed. 09/14/23   [provider]  ibuprofen  (ADVIL ) 800 MG tablet Take 1 tablet (800 mg total) by mouth 3 (three) times daily. 04/19/19   Triplett, Tammy, PA-C       Allergies    Patient has no known allergies.    Review of Systems   Review of Systems  Gastrointestinal:  Positive for abdominal pain.  All other systems reviewed and are negative.   Physical Exam Updated Vital Signs BP (!) 159/98   Pulse 90   Temp 98.7 F (37.1 C) (Oral)   Resp 20   SpO2 94%  Physical Exam Vitals and nursing note reviewed.  Constitutional:      Appearance: Normal appearance.  HENT:     Head: Normocephalic and atraumatic.     Nose: Nose normal.     Mouth/Throat:     Mouth: Mucous membranes are moist.  Eyes:     Extraocular Movements: Extraocular movements intact.     Conjunctiva/sclera: Conjunctivae normal.     Pupils: Pupils are equal, round, and reactive to light.  Cardiovascular:     Rate and Rhythm: Normal rate and regular rhythm.     Pulses: Normal pulses.     Heart sounds: Normal heart sounds.  Pulmonary:     Effort: Pulmonary effort is normal.     Breath sounds: Normal breath sounds.  Abdominal:     General: Abdomen is flat. Bowel sounds are normal. There is no distension.     Palpations: Abdomen is soft. There is no hepatomegaly or splenomegaly.     Tenderness: There is abdominal tenderness in the right upper quadrant. Negative signs include McBurney's  sign.     Hernia: A hernia is present. Hernia is present in the umbilical area.  Musculoskeletal:        General: Normal range of motion.     Cervical back: Normal range of motion and neck supple.  Skin:    General: Skin is warm and dry.     Coloration: Skin is not jaundiced.     Findings: No erythema or rash.  Neurological:     General: No focal deficit present.     Mental Status: He is alert and oriented to person, place, and time. Mental status is at baseline.  Psychiatric:        Mood and Affect: Mood normal.        Behavior: Behavior normal.        Thought Content: Thought content normal.        Judgment: Judgment normal.     ED Results / Procedures / Treatments    Labs (all labs ordered are listed, but only abnormal results are displayed) Labs Reviewed  COMPREHENSIVE METABOLIC PANEL WITH GFR - Abnormal; Notable for the following components:      Result Value   Glucose, Bld 145 (*)    Calcium 8.5 (*)    Total Protein 6.4 (*)    Albumin 3.2 (*)    AST 120 (*)    ALT 98 (*)    All other components within normal limits  LIPASE, BLOOD  CBC  URINALYSIS, ROUTINE W REFLEX MICROSCOPIC  PROTIME-INR    EKG None  Radiology US  Abdomen Limited RUQ (LIVER/GB) Result Date: 11/24/2023 CLINICAL DATA:  Right upper quadrant pain EXAM: ULTRASOUND ABDOMEN LIMITED RIGHT UPPER QUADRANT COMPARISON:  None Available. FINDINGS: Gallbladder: Gallbladder is mildly distended. There is a nonshadowing echogenic rounded structure measuring 3 mm. Possible polyp. No shadowing stones. No wall thickening or adjacent fluid. The sonographer reports pain when scanning the gallbladder but the gallbladder is deep to the liver on these images. Common bile duct: Diameter: 5 mm Liver: Diffusely echogenic hepatic parenchyma consistent with fatty liver infiltration. Portal vein is patent on color Doppler imaging with normal direction of blood flow towards the liver. Other: None. IMPRESSION: Fatty liver infiltration. Distended gallbladder with a possible 3 mm polyp. No shadowing stones. The sonographer does report pain when scanning the gallbladder but difficult to confirm this is a Murphy's sign based on these images. Please correlate with clinical presentation. If needed additional workup as clinically appropriate such as HIDA scan. Electronically Signed   By: Adrianna Horde M.D.   On: 11/24/2023 11:57    Procedures Procedures    Medications Ordered in ED Medications  ondansetron (ZOFRAN) injection 4 mg (has no administration in time range)  lactated ringers bolus 1,000 mL (1,000 mLs Intravenous Bolus 11/24/23 1146)    ED Course/ Medical Decision Making/ A&P                                  Medical Decision Making Amount and/or Complexity of Data Reviewed Labs: ordered. Radiology: ordered.  Risk OTC drugs. Prescription drug management. Decision regarding hospitalization.   This patient presents to the ED for concern of abdominal pain, this involves an extensive number of treatment options, and is a complaint that carries with it a high risk of complications and morbidity.  The differential diagnosis includes acute appendicitis, cholecystitis, small bowel suction, diverticulitis, testicular torsion, pyelonephritis, kidney stone, hernia, pancreatitis, mesenteric ischemia   Co  morbidities that complicate the patient evaluation  None   Additional history obtained:  Additional history obtained from medical records External records from outside source obtained and reviewed including medical records   Lab Tests:  I Ordered, and personally interpreted labs.  The pertinent results include: No leukocytosis, no anemia, elevated AST and ALT, normal kidney function, normal electrolytes, normal lipase, unremarkable urinalysis   Imaging Studies ordered:  I ordered imaging studies including CT scan of the abdomen and pelvis, ultrasound of the right upper quadrant I independently visualized and interpreted imaging which showed distention of the gallbladder, fat-containing hernia I agree with the radiologist interpretation    Consultations Obtained:  I requested consultation with the general surgery, Dr. Collene Dawson,  and discussed lab and imaging findings as well as pertinent plan - they recommend: Admission, OR tomorrow   Problem List / ED Course / Critical interventions / Medication management  Patient does remain stable at this time.  Discussed with patient we will plan for admission to the hospital service for acute cholecystitis and his umbilical hernia.  Patient was already evaluated by Dr. Collene Dawson by general surgery who will plan for OR tomorrow.  She did ask for  hospitalist admission given the patient's chronic alcohol use.  He has been placed on the CIWA protocol at this point.  He has no signs of active withdrawal at this time.  Discussed patient case with Dr. Winferd Hatter with the hospitalist who has accepted for admission. I ordered medication including Unasyn, fentanyl, Zofran, IV fluids for acute cholecystitis, umbilical hernia Reevaluation of the patient after these medicines showed that the patient improved I have reviewed the patients home medicines and have made adjustments as needed   Social Determinants of Health:  None   Test / Admission - Considered:  Admission        Final Clinical Impression(s) / ED Diagnoses Final diagnoses:  None    Rx / DC Orders ED Discharge Orders     None         Roselynn Connors, PA-C 11/24/23 1419    Arvilla Birmingham, MD 11/24/23 [phone]

## 2023-11-24 NOTE — Telephone Encounter (Signed)
 [facility] Dietrich Fragmin, MD [phone] telephone [phone]~ fax   Call placed to patient pain management provider, Dietrich Fragmin, MD. Representative: Zyrene  Advised that patient has been admitted to [facility]. He is tentatively scheduled for surgery on 11/25/2023 and will discharge home on 11/26/2023 pending post operative follow up.   Requested post operative pain management plan. Zyrene advised that Dr. Ambrosio Junker recommends that patient Buprenorphine HCl-Naloxone HCl 8-2 MG film be changed to Buprenorphine HCl. Advised that while patient is admitted to the hospital, medication management is up to the provider. Advised that patient will need to contact pain management office at discharge to discuss treatment plan with provider.   Documentation faxed to Dr. Linnell Richardson office.

## 2023-11-24 NOTE — Progress Notes (Addendum)
 Primary Care Physician:  Fredick Jarred, PA-C Primary Gastroenterologist:  Dr. Mordechai April  Chief Complaint  Patient presents with   Abdominal Pain    Patient here today due to having RUQ pain and nausea and vomiting. Patient has no appetite and eats once per day. 09/21/2023 had a CT scan of abd/pelvis which showed prominence of the common bile duct consider MRCP per report. Had US  10/08/2023 + for Murphy sign noted minimal fatty change seen in liver parenchyma.    HPI:   Eddie Bailey is a 48 y.o. male who presents to the clinic today by referral from his PCP Fredick Jarred for evaluation.  He reports 40-month history of progressively worsening right upper quadrant pain.  States he is very tender in the area.  A slight bump will cause significant pain.  Also notes daily nausea and vomiting.  Reports decreased appetite, only eating 1 meal a day.  HIDA scan 10/14/2023 WNL, gallbladder ejection fraction 62%.  Ultrasound 10/08/2023-no gallstones or sludge, no gallbladder wall thickening, positive Murphy sign.  CBD measuring 0.63 cm top normal in overall size.  Minimal fatty change in the liver parenchyma.  CT abdomen pelvis with contrast 09/21/2023 slight prominence of the CBD measuring 7 mm in diameter.  Otherwise unremarkable.  Blood work 09/21/23 with T. bili 0.4, AST 55, ALT 59, alk phos 130.  Does drink alcohol daily, anywhere from 6-12 twisted teas.  Denies any diarrhea.  History reviewed. No pertinent past medical history.  Past Surgical History:  Procedure Laterality Date   HERNIA REPAIR      Current Outpatient Medications  Medication Sig Dispense Refill   Buprenorphine HCl-Naloxone HCl 8-2 MG FILM Place under the tongue daily at 6 (six) AM.     citalopram (CELEXA) 20 MG tablet Take 1 tablet by mouth daily.     cloNIDine (CATAPRES) 0.1 MG tablet Take 0.1 mg by mouth daily.     divalproex (DEPAKOTE) 500 MG DR tablet Take 500 mg by mouth daily at 6 (six) AM.     esomeprazole (NEXIUM) 40  MG capsule Take 40 mg by mouth every morning.     famotidine (PEPCID) 40 MG tablet Take 40 mg by mouth daily.     gabapentin (NEURONTIN) 300 MG capsule Take 300 mg by mouth 2 (two) times daily.     hydrOXYzine (ATARAX) 50 MG tablet Take 25 mg by mouth 3 (three) times daily as needed.     ibuprofen  (ADVIL ) 800 MG tablet Take 1 tablet (800 mg total) by mouth 3 (three) times daily. 21 tablet 0   No current facility-administered medications for this visit.    Allergies as of 11/24/2023   (No Known Allergies)    History reviewed. No pertinent family history.  Social History   Socioeconomic History   Marital status: Married    Spouse name: Not on file   Number of children: Not on file   Years of education: Not on file   Highest education level: Not on file  Occupational History   Not on file  Tobacco Use   Smoking status: Every Day    Current packs/day: 1.00    Types: Cigarettes   Smokeless tobacco: Never  Vaping Use   Vaping status: Never Used  Substance and Sexual Activity   Alcohol use: Yes    Alcohol/week: 12.0 standard drinks of alcohol    Types: 12 Cans of beer per week   Drug use: Not Currently   Sexual activity: Not on file  Other  Topics Concern   Not on file  Social History Narrative   Not on file   Social Drivers of Health   Financial Resource Strain: Not on file  Food Insecurity: Not on file  Transportation Needs: Not on file  Physical Activity: Not on file  Stress: Not on file  Social Connections: Not on file  Intimate Partner Violence: Not on file    Subjective: Review of Systems  Constitutional:  Negative for chills and fever.  HENT:  Negative for congestion and hearing loss.   Eyes:  Negative for blurred vision and double vision.  Respiratory:  Negative for cough and shortness of breath.   Cardiovascular:  Negative for chest pain and palpitations.  Gastrointestinal:  Positive for abdominal pain, nausea and vomiting. Negative for blood in stool,  constipation, diarrhea, heartburn and melena.  Genitourinary:  Negative for dysuria and urgency.  Musculoskeletal:  Negative for joint pain and myalgias.  Skin:  Negative for itching and rash.  Neurological:  Negative for dizziness and headaches.  Psychiatric/Behavioral:  Negative for depression. The patient is not nervous/anxious.        Objective: BP (!) 157/103 (BP Location: Left Arm, Patient Position: Sitting, Cuff Size: Large)   Pulse 83   Temp 97.8 F (36.6 C) (Temporal)   Ht 6' (1.829 m)   Wt 194 lb 4.8 oz (88.1 kg)   BMI 26.35 kg/m  Physical Exam Constitutional:      Appearance: Normal appearance.  HENT:     Head: Normocephalic and atraumatic.   Eyes:     Extraocular Movements: Extraocular movements intact.     Conjunctiva/sclera: Conjunctivae normal.    Cardiovascular:     Rate and Rhythm: Normal rate and regular rhythm.  Pulmonary:     Effort: Pulmonary effort is normal.     Breath sounds: Normal breath sounds.  Abdominal:     General: Bowel sounds are normal.     Palpations: Abdomen is soft.     Tenderness: There is abdominal tenderness in the right upper quadrant.     Hernia: A hernia is present. Hernia is present in the umbilical area.   Musculoskeletal:        General: Normal range of motion.     Cervical back: Normal range of motion and neck supple.   Skin:    General: Skin is warm.   Neurological:     General: No focal deficit present.     Mental Status: He is alert and oriented to person, place, and time.   Psychiatric:        Mood and Affect: Mood normal.        Behavior: Behavior normal.      Assessment: *Right upper quadrant abdominal pain *Nausea and vomiting *Decreased appetite *Abnormal LFTs *Chronic alcohol use *Umbilical hernia  Plan: Patient is exquisitely tender on palpation in his right upper quadrant today.  Suspect possible acute gallbladder.  I discussed case with general surgery who recommended ER evaluation.  Patient  is agreeable.  Patient also with small umbilical hernia, red, nontender.  To be evaluated by general surgery as well.  Discussed alcohol use.  This could be a role in his abnormal LFTs as well as chronic nausea.  Recommend he slowly taper off.  Discussed possibility of withdrawal symptoms.  Does need colonoscopy for colon cancer screening purposes.  May need to proceed with upper endoscopy at the same time pending clinical course. We can rediscuss this on follow-up visit.  Thank you Fredick Jarred for  the kind referral 11/24/2023 9:16 AM   Disclaimer: This note was dictated with voice recognition software. Similar sounding words can inadvertently be transcribed and may not be corrected upon review.

## 2023-11-24 NOTE — H&P (Signed)
 History and Physical    Patient: Eddie Bailey EXB:[id number] DOB: May 18, 1976 DOA: 11/24/2023 DOS: the patient was seen and examined on 11/24/2023 PCP: Fredick Jarred, PA-C  Patient coming from: Home  Chief Complaint:  Chief Complaint  Patient presents with   Abdominal Pain   Nausea   HPI: Eddie Bailey is a 48 year old male alcohol dependence, anxiety, chronic pain with opioid dependence presenting with about 56-month history of upper abdominal pain.  The patient presented to the ED at [facility] 09/21/23 with abdominal pain.  He had a CT of the abdomen pelvis at that time which showed slight prominence of the common bile duct measuring 7 mm in diameter.  Otherwise unremarkable.  Blood work on the visit showed T. bili 0.4, AST 55, ALT 59, alk phos 130 .  He returned to the ED at [facility] 10/06/23.  He was treated symptomatically and discharged home with lomotil and phenergan.  He subsequently had right upper quadrant ultrasound on 10/08/2023 which showed no gallstones or sludge, no gallbladder wall thickening, positive Murphy sign. CBD measuring 0.63 cm top normal in overall size. Minimal fatty change in the liver parenchyma.   HIDA scan on 10/14/2023 which was negative for cystic duct obstruction.  Gallbladder ejection fraction was 62%.  He states that he has been drinking more alcohol since his abdominal pain began.  He is on long-term suboxone 8/2 daily.  He states that he has been drinking a 12 pack of twisted ice teas daily as well as a couple of bootleggers. He smokes about 2 packs/month.  He denies any other illicit drugs.  Denies any fevers, chills, chest pain, shortness breath, hematochezia, melena, dysuria.  He has had nausea and vomiting.  Has had intermittent loose stools without hematochezia or melena.  In the ED, patient was afebrile hemodynamically stable oxygen saturation 95% room air.  WBC 4.5, hemoglobin 14.7, platelet 315.  AST 120, ALT 48, total bilirubin 0.5, lipase 42.  Sodium 138, potassium 3.7,  bicarbonate 23, serum creatinine 0.79.  UA negative for pyuria.  Right upper quadrant ultrasound showed fatty liver.  There is a distended gallbladder with possible polyp.  Common bile duct 5 mm.  CT abdomen and pelvis--Small fat containing left inguinal hernia. Small fat containing periumbilical hernia,, but otherwise no acute abnormality.  General surgery was consulted to assist with management.  Patient is planned for laparoscopic cholecystectomy and hernia repair on 11/17/2023.  Review of Systems: As mentioned in the history of present illness. All other systems reviewed and are negative. Past Medical History:  Diagnosis Date   Alcohol abuse    10 years   Anxiety    History of substance use    Hypertension    Past Surgical History:  Procedure Laterality Date   HERNIA REPAIR     inguinal   Social History:  reports that he has been smoking cigarettes. He has never used smokeless tobacco. He reports current alcohol use of about 12.0 standard drinks of alcohol per week. He reports that he does not currently use drugs.  No Known Allergies  History reviewed. No pertinent family history.  Prior to Admission medications   Medication Sig Start Date End Date Taking? Authorizing Provider  Buprenorphine HCl-Naloxone HCl 8-2 MG FILM Place 1 Film under the tongue daily at 6 (six) AM. 11/04/23  Yes [provider]  busPIRone (BUSPAR) 10 MG tablet Take 10 mg by mouth 3 (three) times daily. 10/05/23  Yes [provider]  cloNIDine (CATAPRES) 0.1 MG tablet Take 0.1  mg by mouth daily. 08/16/23  Yes [provider]  divalproex (DEPAKOTE) 500 MG DR tablet Take 500 mg by mouth daily at 6 (six) AM. 08/16/23  Yes [provider]  esomeprazole (NEXIUM) 40 MG capsule Take 40 mg by mouth every morning. 09/21/23  Yes [provider]  famotidine (PEPCID) 40 MG tablet Take 40 mg by mouth daily. 10/05/23  Yes [provider]  gabapentin (NEURONTIN) 300 MG capsule Take  300 mg by mouth 2 (two) times daily. 08/16/23  Yes [provider]  hydrOXYzine (ATARAX) 50 MG tablet Take 25 mg by mouth 3 (three) times daily as needed. 09/14/23  Yes [provider]  ibuprofen  (ADVIL ) 800 MG tablet Take 1 tablet (800 mg total) by mouth 3 (three) times daily. 04/19/19  Yes Triplett, Tammy, PA-C  ondansetron (ZOFRAN-ODT) 8 MG disintegrating tablet Take 8 mg by mouth every 8 (eight) hours as needed for vomiting or nausea. 10/05/23  Yes [provider]  BRIXADI 128 MG/0.36ML SOSY Inject 0.36 mLs into the skin every 30 (thirty) days. Patient not taking: Reported on 11/24/2023 11/01/23   [provider]  ondansetron (ZOFRAN-ODT) 4 MG disintegrating tablet Take 4 mg by mouth every 8 (eight) hours as needed. Patient not taking: Reported on 11/24/2023 09/21/23   [provider]    Physical Exam: Vitals:   11/24/23 1230 11/24/23 1300 11/24/23 1330 11/24/23 1400  BP: (!) 142/95 (!) 178/117 (!) 161/113 (!) 157/102  Pulse: 74 86 80 80  Resp: 14 17 18 13   Temp:      TempSrc:      SpO2: 95% 98% 96% 94%   GENERAL:  A&O x 3, NAD, well developed, cooperative, follows commands HEENT: [place]/AT, No thrush, No icterus, No oral ulcers Neck:  No neck mass, No meningismus, soft, supple CV: RRR, no S3, no S4, no rub, no JVD Lungs:  CTA, no wheeze, no rhonchi, good air movement Abd: soft/RUQ tender +BS, nondistended Ext: No edema, no lymphangitis, no cyanosis, no rashes Neuro:  CN II-XII intact, strength 4/5 in RUE, RLE, strength 4/5 LUE, LLE; sensation intact bilateral; no dysmetria; babinski equivocal   Data Reviewed: Data reviewed above in history  Assessment and Plan: Chronic cholecystitis/intractable nausea and vomiting - Appreciate general surgery consult - Planning for laparoscopic cholecystectomy and hernia repair 11/25/23 - RUQ and CT abd/pelvis as discussed above - Judicious opioids -start IVF  Alcohol abuse - Alcohol withdrawal  protocol  Opioid dependence - PDMP reviewed - buprenorphine/nalaxone 8/2, #60, filled 11/04/23  Tobacco abuse -Tobacco cessation discussed  Anxiety/depression -Continue hydroxyzine, Depakote, Celexa   Advance Care Planning: FULL  Consults: general surgery  Family Communication: spouse 6/11  Severity of Illness: The appropriate patient status for this patient is INPATIENT. Inpatient status is judged to be reasonable and necessary in order to provide the required intensity of service to ensure the patient's safety. The patient's presenting symptoms, physical exam findings, and initial radiographic and laboratory data in the context of their chronic comorbidities is felt to place them at high risk for further clinical deterioration. Furthermore, it is not anticipated that the patient will be medically stable for discharge from the hospital within 2 midnights of admission.   * I certify that at the point of admission it is my clinical judgment that the patient will require inpatient hospital care spanning beyond 2 midnights from the point of admission due to high intensity of service, high risk for further deterioration and high frequency of surveillance required.*  Author:  Demaris Fillers, MD 11/24/2023 2:38 PM  For on call review [email or web address].

## 2023-11-24 NOTE — Patient Instructions (Signed)
 I would recommend you go to the emergency room for further evaluation.  I will let the ER physician and surgeon on-call know about your situation.  It was very nice meeting you today.  Dr. Mordechai April

## 2023-11-25 ENCOUNTER — Inpatient Hospital Stay (HOSPITAL_COMMUNITY): Admitting: Certified Registered"

## 2023-11-25 ENCOUNTER — Encounter (HOSPITAL_COMMUNITY): Payer: Self-pay | Admitting: Internal Medicine

## 2023-11-25 ENCOUNTER — Other Ambulatory Visit: Payer: Self-pay

## 2023-11-25 ENCOUNTER — Encounter (HOSPITAL_COMMUNITY)
Admission: EM | Disposition: A | Discharge status: Home / Self Care | Payer: Self-pay | Source: Ambulatory Visit | Attending: Internal Medicine

## 2023-11-25 DIAGNOSIS — K824 Cholesterolosis of gallbladder: Secondary | ICD-10-CM

## 2023-11-25 DIAGNOSIS — R1110 Vomiting, unspecified: Secondary | ICD-10-CM | POA: Diagnosis not present

## 2023-11-25 DIAGNOSIS — K420 Umbilical hernia with obstruction, without gangrene: Secondary | ICD-10-CM

## 2023-11-25 DIAGNOSIS — K811 Chronic cholecystitis: Secondary | ICD-10-CM | POA: Diagnosis not present

## 2023-11-25 DIAGNOSIS — F1029 Alcohol dependence with unspecified alcohol-induced disorder: Secondary | ICD-10-CM | POA: Diagnosis not present

## 2023-11-25 DIAGNOSIS — K760 Fatty (change of) liver, not elsewhere classified: Secondary | ICD-10-CM

## 2023-11-25 DIAGNOSIS — F1120 Opioid dependence, uncomplicated: Secondary | ICD-10-CM | POA: Diagnosis not present

## 2023-11-25 HISTORY — PX: UMBILICAL HERNIA REPAIR: SHX196

## 2023-11-25 HISTORY — PX: DIAGNOSTIC LAPAROSCOPIC LIVER BIOPSY: SHX5797

## 2023-11-25 LAB — COMPREHENSIVE METABOLIC PANEL WITH GFR
ALT: 81 U/L — ABNORMAL HIGH (ref 0–44)
AST: 94 U/L — ABNORMAL HIGH (ref 15–41)
Albumin: 2.8 g/dL — ABNORMAL LOW (ref 3.5–5.0)
Alkaline Phosphatase: 96 U/L (ref 38–126)
Anion gap: 8 (ref 5–15)
BUN: 6 mg/dL (ref 6–20)
CO2: 26 mmol/L (ref 22–32)
Calcium: 8.2 mg/dL — ABNORMAL LOW (ref 8.9–10.3)
Chloride: 103 mmol/L (ref 98–111)
Creatinine, Ser: 0.85 mg/dL (ref 0.61–1.24)
GFR, Estimated: >60 mL/min (ref >60)
Glucose, Bld: 94 mg/dL (ref 70–99)
Potassium: 3.9 mmol/L (ref 3.5–5.1)
Sodium: 137 mmol/L (ref 135–145)
Total Bilirubin: 1.1 mg/dL (ref 0.0–1.2)
Total Protein: 5.7 g/dL — ABNORMAL LOW (ref 6.5–8.1)

## 2023-11-25 LAB — CBC
HCT: 40.4 % (ref 39.0–52.0)
Hemoglobin: 13.5 g/dL (ref 13.0–17.0)
MCH: 30.5 pg (ref 26.0–34.0)
MCHC: 33.4 g/dL (ref 30.0–36.0)
MCV: 91.2 fL (ref 80.0–100.0)
Platelets: 199 10*3/uL (ref 150–400)
RBC: 4.43 MIL/uL (ref 4.22–5.81)
RDW: 12 % (ref 11.5–15.5)
WBC: 4.8 10*3/uL (ref 4.0–10.5)
nRBC: 0 % (ref 0.0–0.2)

## 2023-11-25 LAB — HIV ANTIBODY (ROUTINE TESTING W REFLEX): HIV Screen 4th Generation wRfx: NONREACTIVE

## 2023-11-25 SURGERY — CHOLECYSTECTOMY, ROBOT-ASSISTED, LAPAROSCOPIC
Anesthesia: General | Site: Abdomen

## 2023-11-25 MED ORDER — ROCURONIUM BROMIDE 10 MG/ML (PF) SYRINGE
PREFILLED_SYRINGE | INTRAVENOUS | Status: DC | PRN
  Administered 2023-11-25: 60 mg via INTRAVENOUS

## 2023-11-25 MED ORDER — DEXMEDETOMIDINE HCL IN NACL 80 MCG/20ML IV SOLN
INTRAVENOUS | Status: DC | PRN
  Administered 2023-11-25: 8 ug via INTRAVENOUS
  Administered 2023-11-25: 4 ug via INTRAVENOUS
  Administered 2023-11-25 (×2): 8 ug via INTRAVENOUS

## 2023-11-25 MED ORDER — DEXAMETHASONE SODIUM PHOSPHATE 10 MG/ML IJ SOLN
INTRAMUSCULAR | Status: AC
  Filled 2023-11-25: qty 1

## 2023-11-25 MED ORDER — LACTATED RINGERS IV SOLN: INTRAVENOUS | Status: DC

## 2023-11-25 MED ORDER — STERILE WATER FOR IRRIGATION IR SOLN
Status: DC | PRN
  Administered 2023-11-25: 500 mL

## 2023-11-25 MED ORDER — HYDROMORPHONE HCL 1 MG/ML IJ SOLN: 0.2500 mg | INTRAMUSCULAR | Status: DC | PRN

## 2023-11-25 MED ORDER — ONDANSETRON HCL 4 MG/2ML IJ SOLN
INTRAMUSCULAR | Status: DC | PRN
Start: 2023-11-25 — End: 2023-11-25
  Administered 2023-11-25: 4 mg via INTRAVENOUS

## 2023-11-25 MED ORDER — BUPIVACAINE HCL (PF) 0.5 % IJ SOLN
INTRAMUSCULAR | Status: AC
  Filled 2023-11-25: qty 30

## 2023-11-25 MED ORDER — ONDANSETRON HCL 4 MG/2ML IJ SOLN
INTRAMUSCULAR | Status: AC
  Filled 2023-11-25: qty 2

## 2023-11-25 MED ORDER — FENTANYL CITRATE (PF) 100 MCG/2ML IJ SOLN
INTRAMUSCULAR | Status: DC | PRN
  Administered 2023-11-25: 100 ug via INTRAVENOUS
  Administered 2023-11-25 (×3): 50 ug via INTRAVENOUS

## 2023-11-25 MED ORDER — OXYCODONE HCL 5 MG PO TABS: 5.0000 mg | ORAL_TABLET | Freq: Once | ORAL | Status: DC | PRN

## 2023-11-25 MED ORDER — SUGAMMADEX SODIUM 200 MG/2ML IV SOLN
INTRAVENOUS | Status: DC | PRN
  Administered 2023-11-25: 200 mg via INTRAVENOUS

## 2023-11-25 MED ORDER — FENTANYL CITRATE (PF) 250 MCG/5ML IJ SOLN
INTRAMUSCULAR | Status: AC
  Filled 2023-11-25: qty 5

## 2023-11-25 MED ORDER — MIDAZOLAM HCL 2 MG/2ML IJ SOLN
INTRAMUSCULAR | Status: AC
  Filled 2023-11-25: qty 2

## 2023-11-25 MED ORDER — ORAL CARE MOUTH RINSE: 15.0000 mL | Freq: Once | OROMUCOSAL | Status: AC

## 2023-11-25 MED ORDER — LIDOCAINE 2% (20 MG/ML) 5 ML SYRINGE
INTRAMUSCULAR | Status: AC
  Filled 2023-11-25: qty 5

## 2023-11-25 MED ORDER — MIDAZOLAM HCL 2 MG/2ML IJ SOLN
INTRAMUSCULAR | Status: DC | PRN
  Administered 2023-11-25: 2 mg via INTRAVENOUS

## 2023-11-25 MED ORDER — SODIUM CHLORIDE 0.9 % IV SOLN: 12.5000 mg | INTRAVENOUS | Status: DC | PRN

## 2023-11-25 MED ORDER — FENTANYL CITRATE PF 50 MCG/ML IJ SOSY
50.0000 ug | PREFILLED_SYRINGE | INTRAMUSCULAR | Status: DC | PRN
  Administered 2023-11-25 (×2): 50 ug via INTRAVENOUS
  Filled 2023-11-25 (×8): qty 1

## 2023-11-25 MED ORDER — PROPOFOL 10 MG/ML IV BOLUS
INTRAVENOUS | Status: AC
  Filled 2023-11-25: qty 20

## 2023-11-25 MED ORDER — OXYCODONE HCL 5 MG/5ML PO SOLN: 5.0000 mg | Freq: Once | ORAL | Status: DC | PRN

## 2023-11-25 MED ORDER — INDOCYANINE GREEN 25 MG IV SOLR
INTRAVENOUS | Status: AC
  Filled 2023-11-25: qty 10

## 2023-11-25 MED ORDER — LACTATED RINGERS IV SOLN: INTRAVENOUS | Status: DC | PRN

## 2023-11-25 MED ORDER — BUPIVACAINE HCL (PF) 0.5 % IJ SOLN
INTRAMUSCULAR | Status: DC | PRN
  Administered 2023-11-25: 30 mL

## 2023-11-25 MED ORDER — CHLORDIAZEPOXIDE HCL 5 MG PO CAPS
25.0000 mg | ORAL_CAPSULE | Freq: Three times a day (TID) | ORAL | Status: DC
  Administered 2023-11-25 (×2): 25 mg via ORAL
  Filled 2023-11-25 (×6): qty 5

## 2023-11-25 MED ORDER — DEXAMETHASONE SODIUM PHOSPHATE 10 MG/ML IJ SOLN
INTRAMUSCULAR | Status: DC | PRN
  Administered 2023-11-25: 5 mg via INTRAVENOUS

## 2023-11-25 MED ORDER — CHLORHEXIDINE GLUCONATE 0.12 % MT SOLN
15.0000 mL | Freq: Once | OROMUCOSAL | Status: AC
  Administered 2023-11-25: 15 mL via OROMUCOSAL

## 2023-11-25 MED ORDER — PROPOFOL 10 MG/ML IV BOLUS
INTRAVENOUS | Status: DC | PRN
  Administered 2023-11-25: 200 mg via INTRAVENOUS

## 2023-11-25 MED ORDER — ACETAMINOPHEN 500 MG PO TABS
1000.0000 mg | ORAL_TABLET | Freq: Once | ORAL | Status: AC
  Administered 2023-11-25: 1000 mg via ORAL
  Filled 2023-11-25: qty 2

## 2023-11-25 MED ORDER — LIDOCAINE HCL (CARDIAC) PF 100 MG/5ML IV SOSY
PREFILLED_SYRINGE | INTRAVENOUS | Status: DC | PRN
  Administered 2023-11-25: 100 mg via INTRATRACHEAL

## 2023-11-25 MED ORDER — ACETAMINOPHEN 160 MG/5ML PO SOLN
960.0000 mg | Freq: Once | ORAL | Status: AC
  Filled 2023-11-25: qty 30

## 2023-11-25 MED ORDER — ROCURONIUM BROMIDE 10 MG/ML (PF) SYRINGE
PREFILLED_SYRINGE | INTRAVENOUS | Status: AC
  Filled 2023-11-25: qty 10

## 2023-11-25 SURGICAL SUPPLY — 38 items
BLADE SURG 15 STRL LF DISP TIS (BLADE) ×2 IMPLANT
CAUTERY HOOK MNPLR 1.6 DVNC XI (INSTRUMENTS) ×2 IMPLANT
CHLORAPREP W/TINT 26 (MISCELLANEOUS) ×2 IMPLANT
CLIP LIGATING HEM O LOK PURPLE (MISCELLANEOUS) ×2 IMPLANT
COVER LIGHT HANDLE STERIS (MISCELLANEOUS) ×4 IMPLANT
DERMABOND ADVANCED .7 DNX12 (GAUZE/BANDAGES/DRESSINGS) ×2 IMPLANT
DRAPE ARM DVNC X/XI (DISPOSABLE) ×8 IMPLANT
DRAPE COLUMN DVNC XI (DISPOSABLE) ×2 IMPLANT
ELECTRODE REM PT RTRN 9FT ADLT (ELECTROSURGICAL) ×2 IMPLANT
FORCEPS BPLR R/ABLATION 8 DVNC (INSTRUMENTS) ×2 IMPLANT
FORCEPS PROGRASP DVNC XI (FORCEP) ×2 IMPLANT
GLOVE BIO SURGEON STRL SZ 6.5 (GLOVE) ×4 IMPLANT
GLOVE BIOGEL PI IND STRL 6.5 (GLOVE) ×4 IMPLANT
GLOVE BIOGEL PI IND STRL 7.0 (GLOVE) ×4 IMPLANT
GOWN STRL REUS W/TWL LRG LVL3 (GOWN DISPOSABLE) ×6 IMPLANT
GRASPER SUT TROCAR 14GX15 (MISCELLANEOUS) ×2 IMPLANT
KIT TURNOVER KIT A (KITS) ×2 IMPLANT
NDL HYPO 21X1.5 SAFETY (NEEDLE) ×2 IMPLANT
NDL INSUFFLATION 14GA 120MM (NEEDLE) ×2 IMPLANT
NEEDLE HYPO 21X1.5 SAFETY (NEEDLE) ×2 IMPLANT
NEEDLE INSUFFLATION 14GA 120MM (NEEDLE) ×2 IMPLANT
OBTURATOR OPTICALSTD 8 DVNC (TROCAR) ×2 IMPLANT
PACK LAP CHOLE LZT030E (CUSTOM PROCEDURE TRAY) ×2 IMPLANT
PAD ARMBOARD POSITIONER FOAM (MISCELLANEOUS) ×2 IMPLANT
PAD TELFA 3X4 1S STER (GAUZE/BANDAGES/DRESSINGS) IMPLANT
PENCIL HANDSWITCHING (ELECTRODE) ×2 IMPLANT
POSITIONER HEAD 8X9X4 ADT (SOFTGOODS) ×2 IMPLANT
SEAL UNIV 5-12 XI (MISCELLANEOUS) ×8 IMPLANT
SET BASIN LINEN APH (SET/KITS/TRAYS/PACK) ×2 IMPLANT
SET TUBE SMOKE EVAC HIGH FLOW (TUBING) ×2 IMPLANT
SOL .9 NS 3000ML IRR UROMATIC (IV SOLUTION) ×2 IMPLANT
SUT ETHIBOND NAB MO 7 #0 18IN (SUTURE) IMPLANT
SUT MNCRL AB 4-0 PS2 18 (SUTURE) ×4 IMPLANT
SUT VIC AB 3-0 SH 27X BRD (SUTURE) IMPLANT
SUT VICRYL 0 UR6 27IN ABS (SUTURE) ×2 IMPLANT
SYR 30ML LL (SYRINGE) ×2 IMPLANT
SYSTEM RETRIEVL 5MM INZII UNIV (BASKET) ×2 IMPLANT
WATER STERILE IRR 500ML POUR (IV SOLUTION) ×2 IMPLANT

## 2023-11-25 NOTE — Anesthesia Preprocedure Evaluation (Addendum)
 Anesthesia Evaluation  Patient identified by MRN, date of birth, ID band Patient awake    Reviewed: Allergy & Precautions, H&P , NPO status , Patient's Chart, lab work & pertinent test results, reviewed documented beta blocker date and time   Airway Mallampati: II  TM Distance: >3 FB Neck ROM: full    Dental no notable dental hx. (+) Dental Advisory Given, Teeth Intact   Pulmonary Current Smoker   Pulmonary exam normal breath sounds clear to auscultation       Cardiovascular Exercise Tolerance: Good hypertension, Normal cardiovascular exam Rhythm:regular Rate:Normal     Neuro/Psych  PSYCHIATRIC DISORDERS Anxiety     negative neurological ROS     GI/Hepatic negative GI ROS,,,(+)     substance abuse  alcohol use  Endo/Other  negative endocrine ROS    Renal/GU negative Renal ROS  negative genitourinary   Musculoskeletal   Abdominal   Peds  Hematology negative hematology ROS (+)   Anesthesia Other Findings   Reproductive/Obstetrics negative OB ROS                             Anesthesia Physical Anesthesia Plan  ASA: 2  Anesthesia Plan: General   Post-op Pain Management: Dilaudid IV   Induction:   PONV Risk Score and Plan: Ondansetron, Dexamethasone and Midazolam  Airway Management Planned: Oral ETT  Additional Equipment: None  Intra-op Plan:   Post-operative Plan: Extubation in OR  Informed Consent: I have reviewed the patients History and Physical, chart, labs and discussed the procedure including the risks, benefits and alternatives for the proposed anesthesia with the patient or authorized representative who has indicated his/her understanding and acceptance.     Dental Advisory Given  Plan Discussed with: CRNA  Anesthesia Plan Comments:         Anesthesia Quick Evaluation

## 2023-11-25 NOTE — Hospital Course (Addendum)
 48 year old male alcohol dependence, anxiety, chronic pain with opioid dependence presenting with about 42-month history of upper abdominal pain.  The patient presented to the ED at [facility] 09/21/23 with abdominal pain.  He had a CT of the abdomen pelvis at that time which showed slight prominence of the common bile duct measuring 7 mm in diameter.  Otherwise unremarkable.  Blood work on the visit showed T. bili 0.4, AST 55, ALT 59, alk phos 130 .  He returned to the ED at [facility] 10/06/23.  He was treated symptomatically and discharged home with lomotil and phenergan.  He subsequently had right upper quadrant ultrasound on 10/08/2023 which showed no gallstones or sludge, no gallbladder wall thickening, positive Murphy sign. CBD measuring 0.63 cm top normal in overall size. Minimal fatty change in the liver parenchyma.    HIDA scan on 10/14/2023 which was negative for cystic duct obstruction.  Gallbladder ejection fraction was 62%.   He states that he has been drinking more alcohol since his abdominal pain began.  He is on long-term suboxone 8/2 daily.  He states that he has been drinking a 12 pack of twisted ice teas daily as well as a couple of bootleggers. He smokes about 2 packs/month.  He denies any other illicit drugs.  Denies any fevers, chills, chest pain, shortness breath, hematochezia, melena, dysuria.  He has had nausea and vomiting.  Has had intermittent loose stools without hematochezia or melena.   In the ED, patient was afebrile hemodynamically stable oxygen saturation 95% room air.  WBC 4.5, hemoglobin 14.7, platelet 315.  AST 120, ALT 48, total bilirubin 0.5, lipase 42.  Sodium 138, potassium 3.7, bicarbonate 23, serum creatinine 0.79.  UA negative for pyuria.  Right upper quadrant ultrasound showed fatty liver.  There is a distended gallbladder with possible polyp.  Common bile duct 5 mm.  CT abdomen and pelvis--Small fat containing left inguinal hernia. Small fat containing periumbilical hernia,, but  otherwise no acute abnormality.  General surgery was consulted to assist with management.  Patient is planned for laparoscopic cholecystectomy and hernia repair on 11/17/2023.  Postoperatively, the patient did have some mild withdrawal from his alcohol.  He was placed on Librium  with improvement.  He did have some postoperative abdominal pain.  He was followed by general surgery.  His diet was advanced which she tolerated.  Per discussions, patient's wife is comfortable taking the patient home.  He will need to follow-up with his pain management physician for weaning off his current opioids postoperatively.

## 2023-11-25 NOTE — Progress Notes (Addendum)
 PROGRESS NOTE  Eddie Bailey HYQ:[id number] DOB: 1975/08/15 DOA: 11/24/2023 PCP: Eddie Jarred, PA-C  Brief History:  48 year old male alcohol dependence, anxiety, chronic pain with opioid dependence presenting with about 15-month history of upper abdominal pain.  The patient presented to the ED at [facility] 09/21/23 with abdominal pain.  He had a CT of the abdomen pelvis at that time which showed slight prominence of the common bile duct measuring 7 mm in diameter.  Otherwise unremarkable.  Blood work on the visit showed T. bili 0.4, AST 55, ALT 59, alk phos 130 .  He returned to the ED at [facility] 10/06/23.  He was treated symptomatically and discharged home with lomotil and phenergan.  He subsequently had right upper quadrant ultrasound on 10/08/2023 which showed no gallstones or sludge, no gallbladder wall thickening, positive Murphy sign. CBD measuring 0.63 cm top normal in overall size. Minimal fatty change in the liver parenchyma.    HIDA scan on 10/14/2023 which was negative for cystic duct obstruction.  Gallbladder ejection fraction was 62%.   He states that he has been drinking more alcohol since his abdominal pain began.  He is on long-term suboxone 8/2 daily.  He states that he has been drinking a 12 pack of twisted ice teas daily as well as a couple of bootleggers. He smokes about 2 packs/month.  He denies any other illicit drugs.  Denies any fevers, chills, chest pain, shortness breath, hematochezia, melena, dysuria.  He has had nausea and vomiting.  Has had intermittent loose stools without hematochezia or melena.   In the ED, patient was afebrile hemodynamically stable oxygen saturation 95% room air.  WBC 4.5, hemoglobin 14.7, platelet 315.  AST 120, ALT 48, total bilirubin 0.5, lipase 42.  Sodium 138, potassium 3.7, bicarbonate 23, serum creatinine 0.79.  UA negative for pyuria.  Right upper quadrant ultrasound showed fatty liver.  There is a distended gallbladder with possible polyp.  Common  bile duct 5 mm.  CT abdomen and pelvis--Small fat containing left inguinal hernia. Small fat containing periumbilical hernia,, but otherwise no acute abnormality.  General surgery was consulted to assist with management.  Patient is planned for laparoscopic cholecystectomy and hernia repair on 11/17/2023.   Assessment/Plan: Chronic cholecystitis/intractable nausea and vomiting - Appreciate general surgery consult - Planning for laparoscopic cholecystectomy and hernia repair 11/25/23 - RUQ and CT abd/pelvis as discussed above - Judicious opioids -started IVF -11/25/23--lap chole and umbilical hernia repair; liver bx   Alcohol abuse - Alcohol withdrawal protocol continued   Opioid dependence - PDMP reviewed - buprenorphine/nalaxone 8/2, #60, filled 11/04/23 - had discussed with Dr. Melven Stable. Bowen's office--pt will need to contact pain management office at discharge to discuss treatment plan with provider  - continue buprenorphine alone during hospitalization - pt is agreeable for IV opioid for acute, uncontrolled post op pain   Tobacco abuse -Tobacco cessation discussed   Anxiety/depression -Continue hydroxyzine, Depakote, Celexa, buspar      Family Communication:   wife at bedside 6/12  Consultants:  general surgery  Code Status:  FULL  DVT Prophylaxis:  SCDs   Procedures: As Listed in Progress Note Above  Antibiotics: None        Subjective: Pt has some nausea post op, but it is improving.  Denies f/c, cp sob.  Has post op abd pain--better than preop  Objective: Vitals:   11/25/23 1345 11/25/23 1354 11/25/23 1404 11/25/23 1647  BP: (!) 137/94  (!) 153/98 [name])  141/94  Pulse: (!) 56 83 77 79  Resp: 13 16 18    Temp:      TempSrc:      SpO2: 95% 96% 94%   Weight:      Height:        Intake/Output Summary (Last 24 hours) at 11/25/2023 1801 Last data filed at 11/25/2023 1420 Gross per 24 hour  Intake 2505.89 ml  Output 285 ml  Net 2220.89 ml   Weight change:   Exam:  General:  Pt is alert, follows commands appropriately, not in acute distress HEENT: No icterus, No thrush, No neck mass, [place]/AT Cardiovascular: RRR, S1/S2, no rubs, no gallops Respiratory: CTA bilaterally, no wheezing, no crackles, no rhonchi Abdomen: Soft/+BS, mild diffuse tender, non distended, no guarding Extremities: No edema, No lymphangitis, No petechiae, No rashes, no synovitis   Data Reviewed: I have personally reviewed following labs and imaging studies Basic Metabolic Panel: Recent Labs  Lab 11/24/23 1113 11/25/23 0436  NA 138 137  K 3.7 3.9  CL 103 103  CO2 23 26  GLUCOSE 145* 94  BUN 6 6  CREATININE 0.79 0.85  CALCIUM 8.5* 8.2*   Liver Function Tests: Recent Labs  Lab 11/24/23 1113 11/25/23 0436  AST 120* 94*  ALT 98* 81*  ALKPHOS 102 96  BILITOT 0.5 1.1  PROT 6.4* 5.7*  ALBUMIN 3.2* 2.8*   Recent Labs  Lab 11/24/23 1113  LIPASE 42   No results for input(s): AMMONIA in the last 168 hours. Coagulation Profile: Recent Labs  Lab 11/24/23 1209  INR 0.9   CBC: Recent Labs  Lab 11/24/23 1113 11/25/23 0436  WBC 4.5 4.8  HGB 14.7 13.5  HCT 43.0 40.4  MCV 91.3 91.2  PLT 315 199   Cardiac Enzymes: No results for input(s): CKTOTAL, CKMB, CKMBINDEX, TROPONINI in the last 168 hours. BNP: Invalid input(s): POCBNP CBG: No results for input(s): GLUCAP in the last 168 hours. HbA1C: No results for input(s): HGBA1C in the last 72 hours. Urine analysis:    Component Value Date/Time   COLORURINE YELLOW 11/24/2023 1100   APPEARANCEUR CLEAR 11/24/2023 1100   LABSPEC 1.020 11/24/2023 1100   PHURINE 6.0 11/24/2023 1100   GLUCOSEU 50 (A) 11/24/2023 1100   HGBUR NEGATIVE 11/24/2023 1100   BILIRUBINUR NEGATIVE 11/24/2023 1100   KETONESUR NEGATIVE 11/24/2023 1100   PROTEINUR 30 (A) 11/24/2023 1100   NITRITE NEGATIVE 11/24/2023 1100   LEUKOCYTESUR NEGATIVE 11/24/2023 1100   Sepsis  Labs: @LABRCNTIP (procalcitonin:4,lacticidven:4) ) Recent Results (from the past 240 hours)  Surgical PCR screen     Status: None   Collection Time: 11/24/23  6:38 PM   Specimen: Nasal Mucosa; Nasal Swab  Result Value Ref Range Status   MRSA, PCR NEGATIVE NEGATIVE Final   Staphylococcus aureus NEGATIVE NEGATIVE Final    Comment: (NOTE) The Xpert SA Assay (FDA approved for NASAL specimens in patients 10 years of age and older), is one component of a comprehensive surveillance program. It is not intended to diagnose infection nor to guide or monitor treatment. Performed at [facility], [address], [place], [place] [address]      Scheduled Meds:  buprenorphine  8 mg Sublingual Daily   busPIRone  10 mg Oral TID   chlordiazePOXIDE  25 mg Oral TID   cloNIDine  0.1 mg Oral Daily   divalproex  500 mg Oral Q0600   folic acid  1 mg Oral Daily   gabapentin  300 mg Oral BID   heparin  5,000  Units Subcutaneous Q8H   indocyanine green       multivitamin with minerals  1 tablet Oral Daily   pantoprazole  80 mg Oral Q1200   thiamine  100 mg Oral Daily   Or   thiamine  100 mg Intravenous Daily   Continuous Infusions:  Procedures/Studies: CT ABDOMEN PELVIS W CONTRAST Result Date: 11/24/2023 CLINICAL DATA:  Abdominal wall hernia. EXAM: CT ABDOMEN AND PELVIS WITH CONTRAST TECHNIQUE: Multidetector CT imaging of the abdomen and pelvis was performed using the standard protocol following bolus administration of intravenous contrast. RADIATION DOSE REDUCTION: This exam was performed according to the departmental dose-optimization program which includes automated exposure control, adjustment of the mA and/or kV according to patient size and/or use of iterative reconstruction technique. CONTRAST:  100mL OMNIPAQUE IOHEXOL 300 MG/ML  SOLN COMPARISON:  None Available. FINDINGS: Lower chest: No acute abnormality. Hepatobiliary: No focal liver abnormality is seen. No gallstones, gallbladder wall  thickening, or biliary dilatation. Pancreas: Unremarkable. No pancreatic ductal dilatation or surrounding inflammatory changes. Spleen: Normal in size without focal abnormality. Adrenals/Urinary Tract: Adrenal glands are unremarkable. Kidneys are normal, without renal calculi, focal lesion, or hydronephrosis. Bladder is unremarkable. Stomach/Bowel: Stomach is within normal limits. Appendix appears normal. No evidence of bowel wall thickening, distention, or inflammatory changes. Vascular/Lymphatic: No significant vascular findings are present. No enlarged abdominal or pelvic lymph nodes. Reproductive: Prostate is unremarkable. Other: Small fat containing left inguinal hernia. Small periumbilical fat containing hernia. No ascites. Musculoskeletal: No acute or significant osseous findings. IMPRESSION: Small fat containing left inguinal hernia. Small fat containing periumbilical hernia. No other abnormality seen in the abdomen or pelvis. Electronically Signed   By: Rosalene Colon M.D.   On: 11/24/2023 14:24   US  Abdomen Limited RUQ (LIVER/GB) Result Date: 11/24/2023 CLINICAL DATA:  Right upper quadrant pain EXAM: ULTRASOUND ABDOMEN LIMITED RIGHT UPPER QUADRANT COMPARISON:  None Available. FINDINGS: Gallbladder: Gallbladder is mildly distended. There is a nonshadowing echogenic rounded structure measuring 3 mm. Possible polyp. No shadowing stones. No wall thickening or adjacent fluid. The sonographer reports pain when scanning the gallbladder but the gallbladder is deep to the liver on these images. Common bile duct: Diameter: 5 mm Liver: Diffusely echogenic hepatic parenchyma consistent with fatty liver infiltration. Portal vein is patent on color Doppler imaging with normal direction of blood flow towards the liver. Other: None. IMPRESSION: Fatty liver infiltration. Distended gallbladder with a possible 3 mm polyp. No shadowing stones. The sonographer does report pain when scanning the gallbladder but difficult to  confirm this is a Murphy's sign based on these images. Please correlate with clinical presentation. If needed additional workup as clinically appropriate such as HIDA scan. Electronically Signed   By: Adrianna Horde M.D.   On: 11/24/2023 11:57    Demaris Fillers, DO  [facility]  If 7PM-7AM, please contact night-coverage [email or web address] Password [email or web address] 11/25/2023, 6:01 PM   LOS: 1 day

## 2023-11-25 NOTE — Transfer of Care (Signed)
 Immediate Anesthesia Transfer of Care Note  Patient: Eddie Bailey  Procedure(s) Performed: CHOLECYSTECTOMY, ROBOT-ASSISTED, LAPAROSCOPIC (Abdomen) BIOPSY, LIVER, ROBOT-ASSISTED, LAPAROSCOPIC (Abdomen) PRIMARY REPAIR, HERNIA, UMBILICAL, ADULT (Abdomen)  Patient Location: PACU  Anesthesia Type:General  Level of Consciousness: drowsy  Airway & Oxygen Therapy: Patient Spontanous Breathing and Patient connected to face mask oxygen  Post-op Assessment: Report given to RN and Post -op Vital signs reviewed and stable  Post vital signs: Reviewed and stable  Last Vitals:  Vitals Value Taken Time  BP 118/75 11/25/23 12:53  Temp 97.5   Pulse 77 11/25/23 12:56  Resp 21 11/25/23 12:56  SpO2 98 % 11/25/23 12:56  Vitals shown include unfiled device data.  Last Pain:  Vitals:   11/25/23 1040  TempSrc:   PainSc: 0-No pain      Patients Stated Pain Goal: 0 (11/24/23 1813)  Complications: No notable events documented.

## 2023-11-25 NOTE — Progress Notes (Signed)
 [facility]  Updated his wife. Updated team. Can have diet. Pain control. Can dc home likely tomorrow if eating and have pain med plan.   Deena Farrier, MD [facility] [address] [place], [place] [address] [phone] (office)

## 2023-11-25 NOTE — Discharge Instructions (Signed)
 Discharge Robotic Assisted Laparoscopic Surgery Instructions:  You can remove the gauze/ tape from your belly button on 11/27/23 if it is not already removed. You can shower after removing this. Do not pull the glue off on the incision when you remove this.   Common Complaints: Right shoulder pain is common after laparoscopic surgery.  This is secondary to the gas used in the surgery being trapped under the diaphragm.  Walk to help your body absorb the gas. This will improve in a few days. Pain at the port sites are common, especially the larger port sites. This will improve with time.  Some nausea is common and poor appetite. The main goal is to stay hydrated the first few days after surgery.   Diet/ Activity: Diet as tolerated. You may not have an appetite, but it is important to stay hydrated.  Drink 64 ounces of water a day. Your appetite will return with time.  Shower per your regular routine daily.  Do not take hot showers. Take warm showers that are less than 10 minutes. Rest and listen to your body, but do not remain in bed all day.  Walk everyday for at least 15-20 minutes. Deep cough and move around every 1-2 hours in the first few days after surgery.  Do not lift > 10 lbs, perform excessive bending, pushing, pulling, squatting for 4-6 weeks after surgery. This is mostly to protect your hernia repair.  Do not pick at the dermabond glue on your incision sites.  This glue film will remain in place for 1-2 weeks and will start to peel off.  Do not place lotions or balms on your incision unless instructed to specifically by Dr. Collene Dawson.   Pain Expectations and Narcotics: -After surgery you will have pain associated with your incisions and this is normal. The pain is muscular and nerve pain, and will get better with time. -You are encouraged and expected to take non narcotic medications like tylenol  and ibuprofen  (when able) to treat pain as multiple modalities can aid with pain  treatment. -Narcotics are only used when pain is severe or there is breakthrough pain. -You are not expected to have a pain score of 0 after surgery, as we cannot prevent pain. A pain score of 3-4 that allows you to be functional, move, walk, and tolerate some activity is the goal. The pain will continue to improve over the days after surgery and is dependent on your surgery. -Due to [place] law, we are only able to give a certain amount of pain medication to treat post operative pain, and we only give additional narcotics on a patient by patient basis.  -Having appropriate expectations of pain and knowledge of pain management with non narcotics is important as we do not want anyone to become addicted to narcotic pain medication.  -Using ice packs in the first 48 hours and heating pads after 48 hours, wearing an abdominal binder (when recommended), and using over the counter medications are all ways to help with pain management.   -Simple acts like meditation and mindfulness practices after surgery can also help with pain control and research has proven the benefit of these practices.  Medication: Take tylenol  and ibuprofen  as needed for pain control, alternating every 4-6 hours.  Example:  Tylenol  1000mg  @ 6am, 12noon, 6pm, (Do not exceed 4000mg  of tylenol  a day). Ibuprofen  800mg  @ 9am, 3pm, 9pm, 3am (Do not exceed 3600mg  of ibuprofen  a day).  Take your narcotics per Dr. Linnell Richardson instructions  Contact Information: If you have questions or concerns, please call our office, [phone], Monday- Thursday 8AM-5PM and Friday 8AM-12Noon.  If it is after hours or on the weekend, please call [facility], [phone], [phone], and ask to speak to the surgeon on call for Dr. Collene Dawson at [facility].

## 2023-11-25 NOTE — Op Note (Signed)
 [facility] Operative Note  11/25/23  Preoperative Diagnosis: Chronic cholecystitis, gallbladder polyp, fatty liver, incarcerated umbilical hernia with fat, intractable nausea/vomiting    Postoperative Diagnosis: Same   Procedure(s) Performed: Robotic Assisted Laparoscopic Cholecystectomy, wedge liver biopsy, primary umbilical hernia repair (1cm)    Surgeon: Dixon Fredrickson. Collene Dawson, MD   Assistants: No qualified resident was available    Anesthesia: General endotracheal   Anesthesiologist: Araceli Knight, MD    Specimens: Gallbladder   Estimated Blood Loss: Minimal   Blood Replacement: None    Complications: None   Wound Class: Clean contaminated   Operative Indications: The patient was found to have a gallbladder polyp and fatty liver on imaging and was symptomatic with intractable pain, nausea/vomiting.  We discussed the risk of the procedure including but not limited to bleeding, infection, injury to the common bile duct, bile leak, need for further procedures, chance of subtotal cholecystectomy. I also discussed the risk of bleeding from the liver biopsy, and risk of hernia repair and risk of recurrence.   Findings:  1cm umbilical hernia  Critical view of safety noted All clips intact at the end of the case Adequate hemostasis   Procedure: Firefly was given in the preoperative area. The patient was taken to the operating room and placed supine. General endotracheal anesthesia was induced. Intravenous antibiotics were administered per protocol.  An orogastric tube positioned to decompress the stomach. The abdomen was prepared and draped in the usual sterile fashion.   Her umbilical hernia was incarcerated with fat. After anesthesia, I was able to reduce the fat. I made a supraumbilical incision and carried down to the hernia sack and placed the Veress needle through this while elevating the fascia. Insufflation was started after confirming a positive saline  drop test and no immediate increase in abdominal pressure.  After reaching 15 mm, the Veress needle was removed and a 8 mm port was placed via optiview technique through the umbilical hernia, measuring 20 mm away from the suspected position of the gallbladder.  The abdomen was inspected and no abnormalities or injuries were found.  Under direct vision, ports were placed in the following locations in a semi curvilinear position around the target of the gallbladder. Two 8 mm ports on the patient's right each having 8cm clearance to the adjacent ports and one 8 mm port placed on the patient's left 8 cm from the umbilical port. Once ports were placed, the table was placed in the reverse Trendelenburg position with the right side up. The robotic was brought into the operative field and docked to the ports successfully.  An endoscope was placed through the umbilical port, prograsper through the most lateral right port, forced bipolar to the port just right of the umbilicus, and then a hook cautery in the left port.  The liver was noted to be fatty appearing but no major nodular appearance.   The dome of the gallbladder was grasped with prograsp and retracted over the dome of the liver. Adhesions between the gallbladder and omentum, duodenum and transverse colon were lysed via hook cautery. The infundibulum was grasped with the forced bipolar and retracted toward the right lower quadrant. This maneuver exposed Calot's triangle. Firefly was used throughout the dissection to ensure safe visualization of the cystic duct. The peritoneum overlying the gallbladder infundibulum was then dissected and the cystic duct and cystic artery identified.  Critical view of safety with the liver bed clearly visible behind the duct and artery with no additional structures noted.  The cystic duct and cystic artery were doubly clipped and divided close to the gallbladder.    The gallbladder was then dissected from its peritoneal and  liver bed attachments by electrocautery. A wedge liver biopsy was done with hook cautery.  Hemostasis was checked prior to removing the hook cautery.  A 5mm Endo Catch bag was then placed through the left side port. The robotic was undocked and moved out of the field,  and the gallbladder was removed in the bag.  The gallbladder and the liver biopsy were passed off the table as a specimen. There was no evidence of bleeding from the gallbladder fossa or cystic artery or leakage of the bile from the cystic duct stump. The left port site closed with a 0 vicryl and PMI due to dilation from removing the gallbladder. The abdomen was desufflated and secondary trocars were removed under direct vision.   No bleeding was noted.   I then turned my attention to the umbilical hernia. I dissected out the hernia sac and noted the defect to be 1cm in size. With 0 Ethibond sutures I closed the fascia primarily with interrupted suture. The umbilicus was then tacked down to the fascia with 3-0 Vicryl interrupted suture. All skin incisions were closed with subcuticular sutures of 4-0 monocryl and dermabond.   A small gauze and tegaderm were placed in the umbilicus to help with the umbilicus staying down at the fascia.   Final inspection revealed acceptable hemostasis. All counts were correct at the end of the case. The patient was awakened from anesthesia and extubated without complication. The OG tube was removed.  The patient went to the PACU in stable condition.   Deena Farrier, MD [facility] [address] [place], [place] [address] [phone] (office)

## 2023-11-25 NOTE — Interval H&P Note (Signed)
 History and Physical Interval Note:  11/25/2023 11:00 AM  Eddie Bailey  has presented today for surgery, with the diagnosis of chronic cholecystitis, intractable abdominal pain, nausea, vomiting, fatty liver, incarcerted umbilical hernia.  The various methods of treatment have been discussed with the patient and family. After consideration of risks, benefits and other options for treatment, the patient has consented to  Procedure(s): CHOLECYSTECTOMY, ROBOT-ASSISTED, LAPAROSCOPIC, liver biopsy, and primary umbilical hernia repair (N/A) as a surgical intervention.  The patient's history has been reviewed, patient examined, no change in status, stable for surgery.  I have reviewed the patient's chart and labs.  Questions were answered to the patient's satisfaction.     Awilda Bogus

## 2023-11-25 NOTE — TOC CM/SW Note (Signed)
 Transition of Care [facility]) - Inpatient Brief Assessment   Patient Details  Name: Dylann Gallier MRN: [id number] Date of Birth: 1975-11-11  Transition of Care [facility]) CM/SW Contact:    Juanda Noon Phone Number: 11/25/2023, 2:28 PM   Clinical Narrative: This Clinical research associate added Alcohol / Substance Abuse Couseling/Education resources to AVS. Spoke with wife who is at bedside because patient was sleeping. Spouse agreeable to resources being added.   Transition of Care Department [facility]) has reviewed patient and no TOC needs have been identified at this time. We will continue to monitor patient advancement through interdisciplinary progression rounds. If new patient transition needs arise, please place a TOC consult.   Transition of Care Asessment: Insurance and Status: Insurance coverage has been reviewed Patient has primary care physician: Yes Home environment has been reviewed: Single Family Home with spouse Prior level of function:: Independent Prior/Current Home Services: No current home services Social Drivers of Health Review: SDOH reviewed no interventions necessary Readmission risk has been reviewed: Yes Transition of care needs: no transition of care needs at this time

## 2023-11-25 NOTE — Anesthesia Procedure Notes (Signed)
 Procedure Name: Intubation Date/Time: 11/25/2023 11:24 AM  Performed by: Sherwin Donate, CRNAPre-anesthesia Checklist: Patient identified, Emergency Drugs available, Suction available and Patient being monitored Patient Re-evaluated:Patient Re-evaluated prior to induction Oxygen Delivery Method: Circle system utilized Preoxygenation: Pre-oxygenation with 100% oxygen Induction Type: IV induction Ventilation: Mask ventilation without difficulty Laryngoscope Size: Miller and 3 Grade View: Grade II Tube type: Oral Tube size: 7.5 mm Number of attempts: 1 Airway Equipment and Method: Stylet Placement Confirmation: ETT inserted through vocal cords under direct vision, positive ETCO2 and breath sounds checked- equal and bilateral Secured at: 22 cm Tube secured with: Tape Dental Injury: Teeth and Oropharynx as per pre-operative assessment

## 2023-11-25 NOTE — Anesthesia Postprocedure Evaluation (Signed)
 Anesthesia Post Note  Patient: Eddie Bailey  Procedure(s) Performed: CHOLECYSTECTOMY, ROBOT-ASSISTED, LAPAROSCOPIC (Abdomen) BIOPSY, LIVER, ROBOT-ASSISTED, LAPAROSCOPIC (Abdomen) PRIMARY REPAIR, HERNIA, UMBILICAL, ADULT (Abdomen)  Patient location during evaluation: PACU Anesthesia Type: General Level of consciousness: awake and alert Pain management: pain level controlled Vital Signs Assessment: post-procedure vital signs reviewed and stable Respiratory status: spontaneous breathing, nonlabored ventilation, respiratory function stable and patient connected to nasal cannula oxygen Cardiovascular status: blood pressure returned to baseline and stable Postop Assessment: no apparent nausea or vomiting Anesthetic complications: no   There were no known notable events for this encounter.   Last Vitals:  Vitals:   11/25/23 1354 11/25/23 1404  BP:  (!) 153/98  Pulse: 83 77  Resp: 16 18  Temp:    SpO2: 96% 94%    Last Pain:  Vitals:   11/25/23 1354  TempSrc:   PainSc: 0-No pain                 Yashira Offenberger L Phillipa Morden

## 2023-11-26 ENCOUNTER — Encounter (HOSPITAL_COMMUNITY): Payer: Self-pay | Admitting: General Surgery

## 2023-11-26 DIAGNOSIS — F1120 Opioid dependence, uncomplicated: Secondary | ICD-10-CM | POA: Diagnosis not present

## 2023-11-26 DIAGNOSIS — F10939 Alcohol use, unspecified with withdrawal, unspecified: Secondary | ICD-10-CM | POA: Insufficient documentation

## 2023-11-26 DIAGNOSIS — R1110 Vomiting, unspecified: Secondary | ICD-10-CM | POA: Diagnosis not present

## 2023-11-26 DIAGNOSIS — F1029 Alcohol dependence with unspecified alcohol-induced disorder: Secondary | ICD-10-CM | POA: Diagnosis not present

## 2023-11-26 LAB — COMPREHENSIVE METABOLIC PANEL WITH GFR
ALT: 95 U/L — ABNORMAL HIGH (ref 0–44)
AST: 100 U/L — ABNORMAL HIGH (ref 15–41)
Albumin: 2.8 g/dL — ABNORMAL LOW (ref 3.5–5.0)
Alkaline Phosphatase: 105 U/L (ref 38–126)
Anion gap: 10 (ref 5–15)
BUN: 15 mg/dL (ref 6–20)
CO2: 26 mmol/L (ref 22–32)
Calcium: 8.3 mg/dL — ABNORMAL LOW (ref 8.9–10.3)
Chloride: 101 mmol/L (ref 98–111)
Creatinine, Ser: 1.03 mg/dL (ref 0.61–1.24)
GFR, Estimated: >60 mL/min (ref >60)
Glucose, Bld: 131 mg/dL — ABNORMAL HIGH (ref 70–99)
Potassium: 4.2 mmol/L (ref 3.5–5.1)
Sodium: 137 mmol/L (ref 135–145)
Total Bilirubin: 1.2 mg/dL (ref 0.0–1.2)
Total Protein: 5.9 g/dL — ABNORMAL LOW (ref 6.5–8.1)

## 2023-11-26 LAB — CBC
HCT: 40 % (ref 39.0–52.0)
Hemoglobin: 13.8 g/dL (ref 13.0–17.0)
MCH: 31.6 pg (ref 26.0–34.0)
MCHC: 34.5 g/dL (ref 30.0–36.0)
MCV: 91.5 fL (ref 80.0–100.0)
Platelets: 300 10*3/uL (ref 150–400)
RBC: 4.37 MIL/uL (ref 4.22–5.81)
RDW: 12.2 % (ref 11.5–15.5)
WBC: 10.2 10*3/uL (ref 4.0–10.5)
nRBC: 0 % (ref 0.0–0.2)

## 2023-11-26 LAB — T4, FREE: Free T4: 1.27 ng/dL — ABNORMAL HIGH (ref 0.61–1.12)

## 2023-11-26 LAB — SURGICAL PATHOLOGY

## 2023-11-26 LAB — TSH: TSH: 0.662 u[IU]/mL (ref 0.350–4.500)

## 2023-11-26 LAB — FOLATE: Folate: 10.5 ng/mL (ref >5.9)

## 2023-11-26 LAB — VITAMIN B12: Vitamin B-12: 299 pg/mL (ref 180–914)

## 2023-11-26 NOTE — Progress Notes (Signed)
 [facility]  Patient with hallucinations and withdrawal concern. He is eating and pain is ok right now.   BP (!) 143/84   Pulse 76   Temp 98.6 F (37 C) (Oral)   Resp 18   Ht 6' (1.829 m)   Wt 89.5 kg   SpO2 95%   BMI 26.77 kg/m  Slightly agitated but very nice and redirectable  Soft, mildly distended, port site with dermabond, umbilical site with gauze under dermabond   Patient s/p robotic cholecystectomy, liver biopsy, umbilical hernia repaired primarily. Doing fair but concern for withdrawal with hallucination and agitation last night, required sitter. Discussed with Dr. Winferd Hatter and I agree him going today is not possible.   PRN For pain Diet as tolerated Labs reassuring Biggest issues is alcohol withdrawal  Appreciate Dr. Mela Spinner assistance in complicated patient.   Deena Farrier, MD [facility] [address] [place] [place]low [place], [place] [address] [phone] (office)

## 2023-11-26 NOTE — Progress Notes (Addendum)
 PROGRESS NOTE  Eddie Bailey:[id number] DOB: December 14, 1975 DOA: 11/24/2023 PCP: Fredick Jarred, PA-C  Brief History:  48 year old male alcohol dependence, anxiety, chronic pain with opioid dependence presenting with about 65-month history of upper abdominal pain.  The patient presented to the ED at [facility] 09/21/23 with abdominal pain.  He had a CT of the abdomen pelvis at that time which showed slight prominence of the common bile duct measuring 7 mm in diameter.  Otherwise unremarkable.  Blood work on the visit showed T. bili 0.4, AST 55, ALT 59, alk phos 130 .  He returned to the ED at [facility] 10/06/23.  He was treated symptomatically and discharged home with lomotil and phenergan.  He subsequently had right upper quadrant ultrasound on 10/08/2023 which showed no gallstones or sludge, no gallbladder wall thickening, positive Murphy sign. CBD measuring 0.63 cm top normal in overall size. Minimal fatty change in the liver parenchyma.    HIDA scan on 10/14/2023 which was negative for cystic duct obstruction.  Gallbladder ejection fraction was 62%.   He states that he has been drinking more alcohol since his abdominal pain began.  He is on long-term suboxone 8/2 daily.  He states that he has been drinking a 12 pack of twisted ice teas daily as well as a couple of bootleggers. He smokes about 2 packs/month.  He denies any other illicit drugs.  Denies any fevers, chills, chest pain, shortness breath, hematochezia, melena, dysuria.  He has had nausea and vomiting.  Has had intermittent loose stools without hematochezia or melena.   In the ED, patient was afebrile hemodynamically stable oxygen saturation 95% room air.  WBC 4.5, hemoglobin 14.7, platelet 315.  AST 120, ALT 48, total bilirubin 0.5, lipase 42.  Sodium 138, potassium 3.7, bicarbonate 23, serum creatinine 0.79.  UA negative for pyuria.  Right upper quadrant ultrasound showed fatty liver.  There is a distended gallbladder with possible polyp.  Common  bile duct 5 mm.  CT abdomen and pelvis--Small fat containing left inguinal hernia. Small fat containing periumbilical hernia,, but otherwise no acute abnormality.  General surgery was consulted to assist with management.  Patient is planned for laparoscopic cholecystectomy and hernia repair on 11/17/2023.   Assessment/Plan: Chronic cholecystitis/intractable nausea and vomiting - Appreciate general surgery consult - RUQ and CT abd/pelvis as discussed above - Judicious opioids -started IVF -11/25/23--lap chole and umbilical hernia repair; liver bx -advance diet   Alcohol abuse/Alcohol withdrawal - Alcohol withdrawal protocol continued -CIWA protocol - add librium    Opioid dependence - PDMP reviewed - buprenorphine /nalaxone 8/2, #60, filled 11/04/23 - had discussed with Dr. Melven Stable. Bowen's office--pt will need to contact pain management office at discharge to discuss treatment plan with provider  - continue buprenorphine  alone during hospitalization - pt is agreeable for IV opioid for acute, uncontrolled post op pain   Tobacco abuse -Tobacco cessation discussed   Anxiety/depression -Continue hydroxyzine , Depakote , Celexa, buspar   Social -discussed with spouse--agreeable to keep patient in hospital for now            Family Communication:   wife at bedside 6/13   Consultants:  general surgery   Code Status:  FULL   DVT Prophylaxis:  SCDs     Procedures: As Listed in Progress Note Above   Antibiotics: None       Subjective: Pt states abd pain is getting better.  N/v are improving.  Denies cp, sob, f/c  Objective: Vitals:  11/25/23 1647 11/25/23 2015 11/26/23 0542 11/26/23 0950  BP: (!) 141/94 109/82 109/79 (!) 143/84  Pulse: 79 78 80 76  Resp:  20 18   Temp:  98.1 F (36.7 C) 98.6 F (37 C)   TempSrc:  Oral Oral   SpO2:  97% 95%   Weight:      Height:        Intake/Output Summary (Last 24 hours) at 11/26/2023 1326 Last data filed at 11/25/2023 1420 Gross  per 24 hour  Intake 400 ml  Output 275 ml  Net 125 ml   Weight change:  Exam:  General:  Pt is alert, follows commands appropriately, not in acute distress HEENT: No icterus, No thrush, No neck mass, [place]/AT Cardiovascular: RRR, S1/S2, no rubs, no gallops Respiratory: bibasilar rales.  No wheeze Abdomen: Soft/+BS, mild upper abd tender, non distended, no guarding Extremities: No edema, No lymphangitis, No petechiae, No rashes, no synovitis   Data Reviewed: I have personally reviewed following labs and imaging studies Basic Metabolic Panel: Recent Labs  Lab 11/24/23 1113 11/25/23 0436 11/26/23 0428  NA 138 137 137  K 3.7 3.9 4.2  CL 103 103 101  CO2 23 26 26   GLUCOSE 145* 94 131*  BUN 6 6 15   CREATININE 0.79 0.85 1.03  CALCIUM 8.5* 8.2* 8.3*   Liver Function Tests: Recent Labs  Lab 11/24/23 1113 11/25/23 0436 11/26/23 0428  AST 120* 94* 100*  ALT 98* 81* 95*  ALKPHOS 102 96 105  BILITOT 0.5 1.1 1.2  PROT 6.4* 5.7* 5.9*  ALBUMIN 3.2* 2.8* 2.8*   Recent Labs  Lab 11/24/23 1113  LIPASE 42   No results for input(s): AMMONIA in the last 168 hours. Coagulation Profile: Recent Labs  Lab 11/24/23 1209  INR 0.9   CBC: Recent Labs  Lab 11/24/23 1113 11/25/23 0436 11/26/23 0428  WBC 4.5 4.8 10.2  HGB 14.7 13.5 13.8  HCT 43.0 40.4 40.0  MCV 91.3 91.2 91.5  PLT 315 199 300   Cardiac Enzymes: No results for input(s): CKTOTAL, CKMB, CKMBINDEX, TROPONINI in the last 168 hours. BNP: Invalid input(s): POCBNP CBG: No results for input(s): GLUCAP in the last 168 hours. HbA1C: No results for input(s): HGBA1C in the last 72 hours. Urine analysis:    Component Value Date/Time   COLORURINE YELLOW 11/24/2023 1100   APPEARANCEUR CLEAR 11/24/2023 1100   LABSPEC 1.020 11/24/2023 1100   PHURINE 6.0 11/24/2023 1100   GLUCOSEU 50 (A) 11/24/2023 1100   HGBUR NEGATIVE 11/24/2023 1100   BILIRUBINUR NEGATIVE 11/24/2023 1100   KETONESUR NEGATIVE  11/24/2023 1100   PROTEINUR 30 (A) 11/24/2023 1100   NITRITE NEGATIVE 11/24/2023 1100   LEUKOCYTESUR NEGATIVE 11/24/2023 1100   Sepsis Labs: @LABRCNTIP (procalcitonin:4,lacticidven:4) ) Recent Results (from the past 240 hours)  Surgical PCR screen     Status: None   Collection Time: 11/24/23  6:38 PM   Specimen: Nasal Mucosa; Nasal Swab  Result Value Ref Range Status   MRSA, PCR NEGATIVE NEGATIVE Final   Staphylococcus aureus NEGATIVE NEGATIVE Final    Comment: (NOTE) The Xpert SA Assay (FDA approved for NASAL specimens in patients 78 years of age and older), is one component of a comprehensive surveillance program. It is not intended to diagnose infection nor to guide or monitor treatment. Performed at [facility], [address], [place], [place] [address]      Scheduled Meds:  buprenorphine   8 mg Sublingual Daily   busPIRone   10 mg Oral TID   chlordiazePOXIDE   25 mg Oral TID   cloNIDine   0.1 mg Oral Daily   divalproex   500 mg Oral Q0600   folic acid   1 mg Oral Daily   gabapentin   300 mg Oral BID   heparin   5,000 Units Subcutaneous Q8H   multivitamin with minerals  1 tablet Oral Daily   pantoprazole   80 mg Oral Q1200   thiamine   100 mg Oral Daily   Or   thiamine   100 mg Intravenous Daily   Continuous Infusions:  Procedures/Studies: CT ABDOMEN PELVIS W CONTRAST Result Date: 11/24/2023 CLINICAL DATA:  Abdominal wall hernia. EXAM: CT ABDOMEN AND PELVIS WITH CONTRAST TECHNIQUE: Multidetector CT imaging of the abdomen and pelvis was performed using the standard protocol following bolus administration of intravenous contrast. RADIATION DOSE REDUCTION: This exam was performed according to the departmental dose-optimization program which includes automated exposure control, adjustment of the mA and/or kV according to patient size and/or use of iterative reconstruction technique. CONTRAST:  100mL OMNIPAQUE  IOHEXOL  300 MG/ML  SOLN COMPARISON:  None Available. FINDINGS: Lower chest:  No acute abnormality. Hepatobiliary: No focal liver abnormality is seen. No gallstones, gallbladder wall thickening, or biliary dilatation. Pancreas: Unremarkable. No pancreatic ductal dilatation or surrounding inflammatory changes. Spleen: Normal in size without focal abnormality. Adrenals/Urinary Tract: Adrenal glands are unremarkable. Kidneys are normal, without renal calculi, focal lesion, or hydronephrosis. Bladder is unremarkable. Stomach/Bowel: Stomach is within normal limits. Appendix appears normal. No evidence of bowel wall thickening, distention, or inflammatory changes. Vascular/Lymphatic: No significant vascular findings are present. No enlarged abdominal or pelvic lymph nodes. Reproductive: Prostate is unremarkable. Other: Small fat containing left inguinal hernia. Small periumbilical fat containing hernia. No ascites. Musculoskeletal: No acute or significant osseous findings. IMPRESSION: Small fat containing left inguinal hernia. Small fat containing periumbilical hernia. No other abnormality seen in the abdomen or pelvis. Electronically Signed   By: Rosalene Colon M.D.   On: 11/24/2023 14:24   US  Abdomen Limited RUQ (LIVER/GB) Result Date: 11/24/2023 CLINICAL DATA:  Right upper quadrant pain EXAM: ULTRASOUND ABDOMEN LIMITED RIGHT UPPER QUADRANT COMPARISON:  None Available. FINDINGS: Gallbladder: Gallbladder is mildly distended. There is a nonshadowing echogenic rounded structure measuring 3 mm. Possible polyp. No shadowing stones. No wall thickening or adjacent fluid. The sonographer reports pain when scanning the gallbladder but the gallbladder is deep to the liver on these images. Common bile duct: Diameter: 5 mm Liver: Diffusely echogenic hepatic parenchyma consistent with fatty liver infiltration. Portal vein is patent on color Doppler imaging with normal direction of blood flow towards the liver. Other: None. IMPRESSION: Fatty liver infiltration. Distended gallbladder with a possible 3 mm  polyp. No shadowing stones. The sonographer does report pain when scanning the gallbladder but difficult to confirm this is a Murphy's sign based on these images. Please correlate with clinical presentation. If needed additional workup as clinically appropriate such as HIDA scan. Electronically Signed   By: Adrianna Horde M.D.   On: 11/24/2023 11:57    Demaris Fillers, DO  [facility]  If 7PM-7AM, please contact night-coverage [email or web address] Password [email or web address] 11/26/2023, 1:26 PM   LOS: 2 days

## 2023-11-26 NOTE — Progress Notes (Signed)
 Pt disoriented and hallucinating throughout the night. Pt tried to leave the floor using side exit, security redirected patient to room. 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 PRN Ativan  was given several times throughout the night. Pt began to bump into things and became unsteady on his feet, while also trying to exit his room multiple times. Safety sitter was ordered. Pt resting quietly at this time, respirations even and unlabored.

## 2023-11-27 ENCOUNTER — Emergency Department (HOSPITAL_COMMUNITY)

## 2023-11-27 ENCOUNTER — Encounter (HOSPITAL_COMMUNITY): Payer: Self-pay

## 2023-11-27 ENCOUNTER — Observation Stay (HOSPITAL_COMMUNITY)
Admission: EM | Admit: 2023-11-27 | Discharge: 2023-11-28 | DRG: 897 | Disposition: A | Discharge status: Home / Self Care | Attending: Internal Medicine | Admitting: Internal Medicine

## 2023-11-27 ENCOUNTER — Other Ambulatory Visit: Payer: Self-pay

## 2023-11-27 DIAGNOSIS — F17210 Nicotine dependence, cigarettes, uncomplicated: Secondary | ICD-10-CM | POA: Diagnosis present

## 2023-11-27 DIAGNOSIS — K811 Chronic cholecystitis: Secondary | ICD-10-CM | POA: Diagnosis not present

## 2023-11-27 DIAGNOSIS — I10 Essential (primary) hypertension: Secondary | ICD-10-CM | POA: Diagnosis present

## 2023-11-27 DIAGNOSIS — R7401 Elevation of levels of liver transaminase levels: Secondary | ICD-10-CM | POA: Insufficient documentation

## 2023-11-27 DIAGNOSIS — E8809 Other disorders of plasma-protein metabolism, not elsewhere classified: Secondary | ICD-10-CM | POA: Diagnosis present

## 2023-11-27 DIAGNOSIS — R109 Unspecified abdominal pain: Secondary | ICD-10-CM | POA: Insufficient documentation

## 2023-11-27 DIAGNOSIS — R770 Abnormality of albumin: Secondary | ICD-10-CM | POA: Insufficient documentation

## 2023-11-27 DIAGNOSIS — F32A Depression, unspecified: Secondary | ICD-10-CM | POA: Insufficient documentation

## 2023-11-27 DIAGNOSIS — F1010 Alcohol abuse, uncomplicated: Secondary | ICD-10-CM | POA: Diagnosis present

## 2023-11-27 DIAGNOSIS — R1033 Periumbilical pain: Secondary | ICD-10-CM | POA: Insufficient documentation

## 2023-11-27 DIAGNOSIS — F10139 Alcohol abuse with withdrawal, unspecified: Secondary | ICD-10-CM | POA: Diagnosis not present

## 2023-11-27 DIAGNOSIS — R1110 Vomiting, unspecified: Secondary | ICD-10-CM | POA: Diagnosis not present

## 2023-11-27 DIAGNOSIS — F1029 Alcohol dependence with unspecified alcohol-induced disorder: Secondary | ICD-10-CM | POA: Diagnosis not present

## 2023-11-27 DIAGNOSIS — F10232 Alcohol dependence with withdrawal with perceptual disturbance: Principal | ICD-10-CM | POA: Insufficient documentation

## 2023-11-27 DIAGNOSIS — F10939 Alcohol use, unspecified with withdrawal, unspecified: Secondary | ICD-10-CM | POA: Diagnosis present

## 2023-11-27 DIAGNOSIS — R410 Disorientation, unspecified: Secondary | ICD-10-CM | POA: Insufficient documentation

## 2023-11-27 DIAGNOSIS — F10931 Alcohol use, unspecified with withdrawal delirium: Principal | ICD-10-CM

## 2023-11-27 DIAGNOSIS — F419 Anxiety disorder, unspecified: Secondary | ICD-10-CM | POA: Insufficient documentation

## 2023-11-27 DIAGNOSIS — E46 Unspecified protein-calorie malnutrition: Secondary | ICD-10-CM | POA: Insufficient documentation

## 2023-11-27 DIAGNOSIS — G8929 Other chronic pain: Secondary | ICD-10-CM | POA: Insufficient documentation

## 2023-11-27 DIAGNOSIS — Z79899 Other long term (current) drug therapy: Secondary | ICD-10-CM

## 2023-11-27 DIAGNOSIS — Z6826 Body mass index (BMI) 26.0-26.9, adult: Secondary | ICD-10-CM

## 2023-11-27 DIAGNOSIS — F418 Other specified anxiety disorders: Secondary | ICD-10-CM | POA: Diagnosis present

## 2023-11-27 DIAGNOSIS — Y905 Blood alcohol level of 100-119 mg/100 ml: Secondary | ICD-10-CM | POA: Diagnosis present

## 2023-11-27 DIAGNOSIS — F10151 Alcohol abuse with alcohol-induced psychotic disorder with hallucinations: Secondary | ICD-10-CM | POA: Insufficient documentation

## 2023-11-27 DIAGNOSIS — F1120 Opioid dependence, uncomplicated: Secondary | ICD-10-CM | POA: Diagnosis present

## 2023-11-27 MED ORDER — SODIUM CHLORIDE 0.9 % IV BOLUS
1000.0000 mL | Freq: Once | INTRAVENOUS | Status: AC
  Administered 2023-11-27: 1000 mL via INTRAVENOUS

## 2023-11-27 MED ORDER — OXYCODONE HCL 5 MG PO TABS
5.0000 mg | ORAL_TABLET | Freq: Four times a day (QID) | ORAL | Status: DC | PRN
  Filled 2023-11-27: qty 1

## 2023-11-27 MED ORDER — LORAZEPAM 2 MG/ML IJ SOLN
1.0000 mg | Freq: Once | INTRAMUSCULAR | Status: AC
  Administered 2023-11-27: 1 mg via INTRAVENOUS
  Filled 2023-11-27: qty 1

## 2023-11-27 MED ORDER — CHLORDIAZEPOXIDE HCL 25 MG PO CAPS: 25.0000 mg | ORAL_CAPSULE | Freq: Two times a day (BID) | ORAL | 0 refills | Status: AC

## 2023-11-27 MED ORDER — OXYCODONE HCL 5 MG PO TABS: 5.0000 mg | ORAL_TABLET | Freq: Four times a day (QID) | ORAL | 0 refills | Status: DC | PRN

## 2023-11-27 MED ORDER — CHLORDIAZEPOXIDE HCL 5 MG PO CAPS: 25.0000 mg | ORAL_CAPSULE | Freq: Two times a day (BID) | ORAL | Status: DC

## 2023-11-27 MED ORDER — BUPRENORPHINE HCL-NALOXONE HCL 8-2 MG SL FILM: 1.0000 | ORAL_FILM | Freq: Every day | SUBLINGUAL | Status: AC

## 2023-11-27 NOTE — Progress Notes (Signed)
 [facility]  Doing fair. Pathology all reassuring. Says his RUQ pain is resolved.   Discussed his pathology and that he can stop drinking and prevent his liver pathology from progressing.   A. LIVER WEDGE:  - Steatohepatitis.  GALLBLADDER, CHOLECYSTECTOMY:  -  Chronic cholecystitis  -  No polyp noted   BP 122/88 (BP Location: Left Arm)   Pulse 74   Temp 99.1 F (37.3 C) (Oral)   Resp 16   Ht 6' (1.829 m)   Wt 89.5 kg   SpO2 98%   BMI 26.77 kg/m  Soft, mild  bruising at umbilicus, removed the gauze that was helping umbilicus skin stay down, no erythema, incisions with dermabond, mod tender around umbilical hernia repair   Discussed that will be up to Dr. Winferd Hatter and wife if he is able to leave and when will be best for him given his alcohol use and ativan  in the hospital.   If he is here tomorrow, I will see otherwise I will see in 1 month.   Future Appointments  Date Time Provider Department Center  12/21/2023  9:15 AM Awilda Bogus, MD [facility] None   Deena Farrier, MD [facility] [address] [place], [place] [address] [phone] (office)

## 2023-11-27 NOTE — Discharge Summary (Addendum)
 Physician Discharge Summary   Patient: Eddie Bailey MRN: [id number] DOB: 04/05/1976  Admit date:     11/24/2023  Discharge date: 11/27/23  Discharge Physician: Myrtie Atkinson Tamryn Popko   PCP: Fredick Jarred, PA-C   Recommendations at discharge:   Please follow up with primary care provider within 1-2 weeks  Please repeat CMP and CBC in one week     Hospital Course: 48 year old male alcohol dependence, anxiety, chronic pain with opioid dependence presenting with about 101-month history of upper abdominal pain.  The patient presented to the ED at [facility] 09/21/23 with abdominal pain.  He had a CT of the abdomen pelvis at that time which showed slight prominence of the common bile duct measuring 7 mm in diameter.  Otherwise unremarkable.  Blood work on the visit showed T. bili 0.4, AST 55, ALT 59, alk phos 130 .  He returned to the ED at [facility] 10/06/23.  He was treated symptomatically and discharged home with lomotil and phenergan.  He subsequently had right upper quadrant ultrasound on 10/08/2023 which showed no gallstones or sludge, no gallbladder wall thickening, positive Murphy sign. CBD measuring 0.63 cm top normal in overall size. Minimal fatty change in the liver parenchyma.    HIDA scan on 10/14/2023 which was negative for cystic duct obstruction.  Gallbladder ejection fraction was 62%.   He states that he has been drinking more alcohol since his abdominal pain began.  He is on long-term suboxone 8/2 daily.  He states that he has been drinking a 12 pack of twisted ice teas daily as well as a couple of bootleggers. He smokes about 2 packs/month.  He denies any other illicit drugs.  Denies any fevers, chills, chest pain, shortness breath, hematochezia, melena, dysuria.  He has had nausea and vomiting.  Has had intermittent loose stools without hematochezia or melena.   In the ED, patient was afebrile hemodynamically stable oxygen saturation 95% room air.  WBC 4.5, hemoglobin 14.7, platelet 315.  AST 120, ALT 48, total  bilirubin 0.5, lipase 42.  Sodium 138, potassium 3.7, bicarbonate 23, serum creatinine 0.79.  UA negative for pyuria.  Right upper quadrant ultrasound showed fatty liver.  There is a distended gallbladder with possible polyp.  Common bile duct 5 mm.  CT abdomen and pelvis--Small fat containing left inguinal hernia. Small fat containing periumbilical hernia,, but otherwise no acute abnormality.  General surgery was consulted to assist with management.  Patient is planned for laparoscopic cholecystectomy and hernia repair on 11/17/2023.  Postoperatively, the patient did have some mild withdrawal from his alcohol.  He was placed on Librium  with improvement.  He did have some postoperative abdominal pain.  He was followed by general surgery.  His diet was advanced which she tolerated.  Per discussions, patient's wife is comfortable taking the patient home.  He will need to follow-up with his pain management physician for weaning off his current opioids postoperatively.  Assessment and Plan: Chronic cholecystitis/intractable nausea and vomiting - Appreciate general surgery consult - RUQ and CT abd/pelvis as discussed above - Judicious opioids -started IVF -11/25/23--lap chole and umbilical hernia repair; liver bx -advance diet which pt tolerated -6/14--discussed with Dr. Cruz Door to d/c from surgical standpoint   Alcohol abuse/Alcohol withdrawal - Alcohol withdrawal protocol continued -CIWA protocol -overall improved - added librium >>weaning off next 48 hours   Opioid dependence - PDMP reviewed - buprenorphine /nalaxone 8/2, #60, filled 11/04/23 - had discussed with Dr. Melven Stable. Bowen's office--pt will need to contact pain management office at discharge to discuss  treatment plan with provider  - continue buprenorphine  alone during hospitalization - pt is agreeable for IV opioid for acute, uncontrolled post op pain -transitioned to po opioids   Tobacco abuse -Tobacco cessation discussed    Anxiety/depression -Continue hydroxyzine , Depakote , Celexa, buspar    Social -discussed with spouse--she is agreeable to take patient home as he is medically stable -we discussed withholding librium  if pt starts drinking alcohol again.  I discussed with her to withhold the buprenorphine /nalaxone until pt follows up with his pain management physician on 6/16 and short term Rx oxycodone           Consultants: general surgery Procedures performed: lap chole  Disposition: Home Diet recommendation:  Cardiac diet DISCHARGE MEDICATION: Allergies as of 11/27/2023   No Known Allergies      Medication List     STOP taking these medications    Brixadi  128 MG/0.36ML Sosy Generic drug: Buprenorphine  ER   ondansetron  4 MG disintegrating tablet Commonly known as: ZOFRAN -ODT   ondansetron  8 MG disintegrating tablet Commonly known as: ZOFRAN -ODT       TAKE these medications    Buprenorphine  HCl-Naloxone HCl 8-2 MG Film Place 1 Film under the tongue daily at 6 (six) AM. Do not restart until you follow up with pain management physician What changed: additional instructions   busPIRone  10 MG tablet Commonly known as: BUSPAR  Take 10 mg by mouth 3 (three) times daily.   chlordiazePOXIDE  25 MG capsule Commonly known as: LIBRIUM  Take 1 capsule (25 mg total) by mouth 2 (two) times daily.   cloNIDine  0.1 MG tablet Commonly known as: CATAPRES  Take 0.1 mg by mouth daily.   divalproex  500 MG DR tablet Commonly known as: DEPAKOTE  Take 500 mg by mouth daily at 6 (six) AM.   esomeprazole 40 MG capsule Commonly known as: NEXIUM Take 40 mg by mouth every morning.   famotidine 40 MG tablet Commonly known as: PEPCID Take 40 mg by mouth daily.   gabapentin  300 MG capsule Commonly known as: NEURONTIN  Take 300 mg by mouth 2 (two) times daily.   hydrOXYzine  50 MG tablet Commonly known as: ATARAX  Take 25 mg by mouth 3 (three) times daily as needed.   ibuprofen  800 MG  tablet Commonly known as: ADVIL  Take 1 tablet (800 mg total) by mouth 3 (three) times daily.   oxyCODONE  5 MG immediate release tablet Commonly known as: Oxy IR/ROXICODONE  Take 1 tablet (5 mg total) by mouth every 6 (six) hours as needed for moderate pain (pain score 4-6).        Follow-up Information     Awilda Bogus, MD Follow up on 12/21/2023.   Specialty: General Surgery Why: hernia check Contact information: [address] [place] [facility] [address] [phone]                Discharge Exam: Filed Weights   11/24/23 1750  Weight: 89.5 kg   HEENT:  [place]/AT, No thrush, no icterus CV:  RRR, no rub, no S3, no S4 Lung:  CTA, no wheeze, no rhonchi Abd:  soft/+BS, NT Ext:  No edema, no lymphangitis, no synovitis, no rash   Condition at discharge: stable  The results of significant diagnostics from this hospitalization (including imaging, microbiology, ancillary and laboratory) are listed below for reference.   Imaging Studies: CT ABDOMEN PELVIS W CONTRAST Result Date: 11/24/2023 CLINICAL DATA:  Abdominal wall hernia. EXAM: CT ABDOMEN AND PELVIS WITH CONTRAST TECHNIQUE: Multidetector CT imaging of the abdomen and pelvis was performed using the standard protocol  following bolus administration of intravenous contrast. RADIATION DOSE REDUCTION: This exam was performed according to the departmental dose-optimization program which includes automated exposure control, adjustment of the mA and/or kV according to patient size and/or use of iterative reconstruction technique. CONTRAST:  OMNIPAQUE  IOHEXOL  300 MG/ML  SOLN COMPARISON:  None Available. FINDINGS: Lower chest: No acute abnormality. Hepatobiliary: No focal liver abnormality is seen. No gallstones, gallbladder wall thickening, or biliary dilatation. Pancreas: Unremarkable. No pancreatic ductal dilatation or surrounding inflammatory changes. Spleen: Normal in size without focal abnormality. Adrenals/Urinary Tract:  Adrenal glands are unremarkable. Kidneys are normal, without renal calculi, focal lesion, or hydronephrosis. Bladder is unremarkable. Stomach/Bowel: Stomach is within normal limits. Appendix appears normal. No evidence of bowel wall thickening, distention, or inflammatory changes. Vascular/Lymphatic: No significant vascular findings are present. No enlarged abdominal or pelvic lymph nodes. Reproductive: Prostate is unremarkable. Other: Small fat containing left inguinal hernia. Small periumbilical fat containing hernia. No ascites. Musculoskeletal: No acute or significant osseous findings. IMPRESSION: Small fat containing left inguinal hernia. Small fat containing periumbilical hernia. No other abnormality seen in the abdomen or pelvis. Electronically Signed   By: Rosalene Colon M.D.   On: 11/24/2023 14:24   US  Abdomen Limited RUQ (LIVER/GB) Result Date: 11/24/2023 CLINICAL DATA:  Right upper quadrant pain EXAM: ULTRASOUND ABDOMEN LIMITED RIGHT UPPER QUADRANT COMPARISON:  None Available. FINDINGS: Gallbladder: Gallbladder is mildly distended. There is a nonshadowing echogenic rounded structure measuring 3 mm. Possible polyp. No shadowing stones. No wall thickening or adjacent fluid. The sonographer reports pain when scanning the gallbladder but the gallbladder is deep to the liver on these images. Common bile duct: Diameter: 5 mm Liver: Diffusely echogenic hepatic parenchyma consistent with fatty liver infiltration. Portal vein is patent on color Doppler imaging with normal direction of blood flow towards the liver. Other: None. IMPRESSION: Fatty liver infiltration. Distended gallbladder with a possible 3 mm polyp. No shadowing stones. The sonographer does report pain when scanning the gallbladder but difficult to confirm this is a Murphy's sign based on these images. Please correlate with clinical presentation. If needed additional workup as clinically appropriate such as HIDA scan. Electronically Signed   By:  Adrianna Horde M.D.   On: 11/24/2023 11:57    Microbiology: Results for orders placed or performed during the hospital encounter of 11/24/23  Surgical PCR screen     Status: None   Collection Time: 11/24/23  6:38 PM   Specimen: Nasal Mucosa; Nasal Swab  Result Value Ref Range Status   MRSA, PCR NEGATIVE NEGATIVE Final   Staphylococcus aureus NEGATIVE NEGATIVE Final    Comment: (NOTE) The Xpert SA Assay (FDA approved for NASAL specimens in patients 24 years of age and older), is one component of a comprehensive surveillance program. It is not intended to diagnose infection nor to guide or monitor treatment. Performed at [facility], [address] [place], [place] [address]     Labs: CBC: Recent Labs  Lab 11/24/23 1113 11/25/23 0436 11/26/23 0428  WBC 4.5 4.8 10.2  HGB 14.7 13.5 13.8  HCT 43.0 40.4 40.0  MCV 91.3 91.2 91.5  PLT 315 199 300   Basic Metabolic Panel: Recent Labs  Lab 11/24/23 1113 11/25/23 0436 11/26/23 0428  NA 138 137 137  K 3.7 3.9 4.2  CL 103 103 101  CO2 23 26 26   GLUCOSE 145* 94 131*  BUN 6 6 15   CREATININE 0.79 0.85 1.03  CALCIUM 8.5* 8.2* 8.3*   Liver Function Tests: Recent Labs  Lab 11/24/23 1113 11/25/23 0436 11/26/23 0428  AST 120* 94* 100*  ALT 98* 81* 95*  ALKPHOS 102 96 105  BILITOT 0.5 1.1 1.2  PROT 6.4* 5.7* 5.9*  ALBUMIN 3.2* 2.8* 2.8*   CBG: No results for input(s): GLUCAP in the last 168 hours.  Discharge time spent: greater than 30 minutes.  Signed: Demaris Fillers, MD [facility] 11/27/2023

## 2023-11-27 NOTE — Plan of Care (Signed)
   Problem: Health Behavior/Discharge Planning: Goal: Ability to manage health-related needs will improve Outcome: Progressing   Problem: Clinical Measurements: Goal: Will remain free from infection Outcome: Progressing   Problem: Activity: Goal: Risk for activity intolerance will decrease Outcome: Progressing

## 2023-11-27 NOTE — ED Triage Notes (Signed)
 Pt had surgery last night and was dc from hospital this morning. Pt family now stating that he is hallucinating and talking out of his head. Pt last had a drink 30 min ago. Very lethargic in triage.

## 2023-11-27 NOTE — ED Notes (Signed)
 ED Provider at bedside.

## 2023-11-27 NOTE — Progress Notes (Signed)
 No nausea today. Abd pain rated a 9.  Had bm this morning.  Sites to abd dry and intact. Drowsy .  IV removed and DC instructions reviewed.  Wife here to drive home.

## 2023-11-28 ENCOUNTER — Ambulatory Visit: Payer: Self-pay | Admitting: Internal Medicine

## 2023-11-28 ENCOUNTER — Encounter (HOSPITAL_COMMUNITY): Payer: Self-pay | Admitting: Internal Medicine

## 2023-11-28 DIAGNOSIS — R7401 Elevation of levels of liver transaminase levels: Secondary | ICD-10-CM | POA: Insufficient documentation

## 2023-11-28 DIAGNOSIS — F10930 Alcohol use, unspecified with withdrawal, uncomplicated: Secondary | ICD-10-CM

## 2023-11-28 DIAGNOSIS — Z6826 Body mass index (BMI) 26.0-26.9, adult: Secondary | ICD-10-CM | POA: Diagnosis not present

## 2023-11-28 DIAGNOSIS — F17210 Nicotine dependence, cigarettes, uncomplicated: Secondary | ICD-10-CM | POA: Diagnosis present

## 2023-11-28 DIAGNOSIS — R1033 Periumbilical pain: Secondary | ICD-10-CM | POA: Diagnosis not present

## 2023-11-28 DIAGNOSIS — E8809 Other disorders of plasma-protein metabolism, not elsewhere classified: Secondary | ICD-10-CM | POA: Diagnosis present

## 2023-11-28 DIAGNOSIS — R109 Unspecified abdominal pain: Secondary | ICD-10-CM | POA: Insufficient documentation

## 2023-11-28 DIAGNOSIS — F1120 Opioid dependence, uncomplicated: Secondary | ICD-10-CM | POA: Diagnosis present

## 2023-11-28 DIAGNOSIS — E46 Unspecified protein-calorie malnutrition: Secondary | ICD-10-CM | POA: Insufficient documentation

## 2023-11-28 DIAGNOSIS — Z720 Tobacco use: Secondary | ICD-10-CM

## 2023-11-28 DIAGNOSIS — Z79899 Other long term (current) drug therapy: Secondary | ICD-10-CM | POA: Diagnosis not present

## 2023-11-28 DIAGNOSIS — I10 Essential (primary) hypertension: Secondary | ICD-10-CM | POA: Diagnosis present

## 2023-11-28 DIAGNOSIS — F418 Other specified anxiety disorders: Secondary | ICD-10-CM | POA: Diagnosis present

## 2023-11-28 DIAGNOSIS — Y905 Blood alcohol level of 100-119 mg/100 ml: Secondary | ICD-10-CM | POA: Diagnosis present

## 2023-11-28 DIAGNOSIS — F10139 Alcohol abuse with withdrawal, unspecified: Secondary | ICD-10-CM | POA: Diagnosis present

## 2023-11-28 LAB — AMMONIA: Ammonia: 18 umol/L (ref 9–35)

## 2023-11-28 LAB — PROTIME-INR
INR: 0.9 (ref 0.8–1.2)
Prothrombin Time: 11.8 s (ref 11.4–15.2)

## 2023-11-28 LAB — COMPREHENSIVE METABOLIC PANEL WITH GFR
ALT: 109 U/L — ABNORMAL HIGH (ref 0–44)
AST: 138 U/L — ABNORMAL HIGH (ref 15–41)
Albumin: 3 g/dL — ABNORMAL LOW (ref 3.5–5.0)
Alkaline Phosphatase: 129 U/L — ABNORMAL HIGH (ref 38–126)
Anion gap: 13 (ref 5–15)
BUN: 9 mg/dL (ref 6–20)
CO2: 23 mmol/L (ref 22–32)
Calcium: 8.3 mg/dL — ABNORMAL LOW (ref 8.9–10.3)
Chloride: 100 mmol/L (ref 98–111)
Creatinine, Ser: 0.77 mg/dL (ref 0.61–1.24)
GFR, Estimated: >60 mL/min (ref >60)
Glucose, Bld: 90 mg/dL (ref 70–99)
Potassium: 3.8 mmol/L (ref 3.5–5.1)
Sodium: 136 mmol/L (ref 135–145)
Total Bilirubin: 0.7 mg/dL (ref 0.0–1.2)
Total Protein: 6.3 g/dL — ABNORMAL LOW (ref 6.5–8.1)

## 2023-11-28 LAB — CBC
HCT: 38.5 % — ABNORMAL LOW (ref 39.0–52.0)
Hemoglobin: 13 g/dL (ref 13.0–17.0)
MCH: 31.2 pg (ref 26.0–34.0)
MCHC: 33.8 g/dL (ref 30.0–36.0)
MCV: 92.3 fL (ref 80.0–100.0)
Platelets: 283 10*3/uL (ref 150–400)
RBC: 4.17 MIL/uL — ABNORMAL LOW (ref 4.22–5.81)
RDW: 12.9 % (ref 11.5–15.5)
WBC: 5.2 10*3/uL (ref 4.0–10.5)
nRBC: 0 % (ref 0.0–0.2)

## 2023-11-28 LAB — MRSA NEXT GEN BY PCR, NASAL: MRSA by PCR Next Gen: NOT DETECTED

## 2023-11-28 LAB — ETHANOL: Alcohol, Ethyl (B): 119 mg/dL — ABNORMAL HIGH (ref <15)

## 2023-11-28 LAB — PHOSPHORUS: Phosphorus: 4.1 mg/dL (ref 2.5–4.6)

## 2023-11-28 LAB — MAGNESIUM: Magnesium: 2.2 mg/dL (ref 1.7–2.4)

## 2023-11-28 MED ORDER — OXYCODONE HCL 5 MG PO TABS
5.0000 mg | ORAL_TABLET | ORAL | Status: DC | PRN
  Administered 2023-11-28: 5 mg via ORAL
  Filled 2023-11-28: qty 1

## 2023-11-28 MED ORDER — THIAMINE MONONITRATE 100 MG PO TABS
100.0000 mg | ORAL_TABLET | Freq: Every day | ORAL | Status: DC
  Administered 2023-11-28: 100 mg via ORAL
  Filled 2023-11-28: qty 1

## 2023-11-28 MED ORDER — ENOXAPARIN SODIUM 40 MG/0.4ML IJ SOSY
40.0000 mg | PREFILLED_SYRINGE | INTRAMUSCULAR | Status: DC
  Administered 2023-11-28: 40 mg via SUBCUTANEOUS
  Filled 2023-11-28: qty 0.4

## 2023-11-28 MED ORDER — ENSURE PLUS HIGH PROTEIN PO LIQD
237.0000 mL | Freq: Two times a day (BID) | ORAL | Status: DC
  Administered 2023-11-28: 237 mL via ORAL

## 2023-11-28 MED ORDER — CHLORHEXIDINE GLUCONATE CLOTH 2 % EX PADS
6.0000 | MEDICATED_PAD | Freq: Every day | CUTANEOUS | Status: DC
  Administered 2023-11-28 (×2): 6 via TOPICAL

## 2023-11-28 MED ORDER — FOLIC ACID 1 MG PO TABS
1.0000 mg | ORAL_TABLET | Freq: Every day | ORAL | Status: DC
  Administered 2023-11-28: 1 mg via ORAL
  Filled 2023-11-28: qty 1

## 2023-11-28 MED ORDER — LORAZEPAM 2 MG/ML IJ SOLN
2.0000 mg | Freq: Once | INTRAMUSCULAR | Status: AC
  Administered 2023-11-28: 2 mg via INTRAVENOUS
  Filled 2023-11-28: qty 1

## 2023-11-28 MED ORDER — LORAZEPAM 2 MG/ML IJ SOLN: 1.0000 mg | INTRAMUSCULAR | Status: DC | PRN

## 2023-11-28 MED ORDER — DIVALPROEX SODIUM 250 MG PO DR TAB
500.0000 mg | DELAYED_RELEASE_TABLET | Freq: Every day | ORAL | Status: DC
  Administered 2023-11-28: 500 mg via ORAL
  Filled 2023-11-28: qty 2

## 2023-11-28 MED ORDER — THIAMINE HCL 100 MG/ML IJ SOLN: 100.0000 mg | Freq: Every day | INTRAMUSCULAR | Status: DC

## 2023-11-28 MED ORDER — BUSPIRONE HCL 5 MG PO TABS
10.0000 mg | ORAL_TABLET | Freq: Three times a day (TID) | ORAL | Status: DC
  Administered 2023-11-28: 10 mg via ORAL
  Filled 2023-11-28: qty 2

## 2023-11-28 MED ORDER — LORAZEPAM 1 MG PO TABS: 1.0000 mg | ORAL_TABLET | ORAL | Status: DC | PRN

## 2023-11-28 MED ORDER — ONDANSETRON HCL 4 MG PO TABS: 4.0000 mg | ORAL_TABLET | Freq: Four times a day (QID) | ORAL | Status: DC | PRN

## 2023-11-28 MED ORDER — HYDROXYZINE HCL 25 MG PO TABS: 25.0000 mg | ORAL_TABLET | Freq: Three times a day (TID) | ORAL | Status: DC | PRN

## 2023-11-28 MED ORDER — ONDANSETRON HCL 4 MG/2ML IJ SOLN: 4.0000 mg | Freq: Four times a day (QID) | INTRAMUSCULAR | Status: DC | PRN

## 2023-11-28 MED ORDER — ADULT MULTIVITAMIN W/MINERALS CH
1.0000 | ORAL_TABLET | Freq: Every day | ORAL | Status: DC
  Administered 2023-11-28: 1 via ORAL
  Filled 2023-11-28: qty 1

## 2023-11-28 MED ORDER — PANTOPRAZOLE SODIUM 40 MG PO TBEC
80.0000 mg | DELAYED_RELEASE_TABLET | Freq: Every day | ORAL | Status: DC
  Administered 2023-11-28: 80 mg via ORAL
  Filled 2023-11-28: qty 2

## 2023-11-28 NOTE — Plan of Care (Signed)

## 2023-11-28 NOTE — Progress Notes (Signed)
 Patient alert and orientedx4. No current complaints of pain, shortness of breath, chest pain, dizziness, nausea or vomiting. Patient up out of bed, ambulatory with supervision to commode, recliner chair and in room. Gait steady. Patient tolerated PO meds and diet well. Appetite good. Went over discharge summary, follow up information and medication education with patient and patient wife. All questions answered and both patient and patient wife expressed full understanding of summary, information and education with teach back. IV removed with no complications. Patient discharged home with all belongings via car.

## 2023-11-28 NOTE — TOC CM/SW Note (Signed)
 Transition of Care [facility]) - Inpatient Brief Assessment   Patient Details  Name: Eddie Bailey MRN: [id number] Date of Birth: July 30, 1975  Transition of [facility] CM/SW Contact:    Grandville Lax, LCSWA Phone Number: 11/28/2023, 9:12 AM   Clinical Narrative: Transition of Care Department [facility]) has reviewed patient and no TOC needs have been identified at this time. We will continue to monitor patient advancement through interdiciplinary progression rounds. If new patient transition needs arise, please place a TOC consult.  Transition of Care Asessment: Insurance and Status: Insurance coverage has been reviewed Patient has primary care physician: Yes Home environment has been reviewed: from home Prior level of function:: independent Prior/Current Home Services: No current home services Social Drivers of Health Review: SDOH reviewed no interventions necessary Readmission risk has been reviewed: Yes Transition of care needs: no transition of care needs at this time

## 2023-11-28 NOTE — Discharge Summary (Signed)
 Physician Discharge Summary  Eddie Bailey XBJ:[id number] DOB: 01/02/1976 DOA: 11/27/2023  PCP: Fredick Jarred, PA-C  Admit date: 11/27/2023  Discharge date: 11/28/2023  Admitted From:Home  Disposition:  Home  Recommendations for Outpatient Follow-up:  Follow up with PCP in 1-2 weeks Continue medications as prescribed on 6/14 Counseled on alcohol cessation and resources have been provided previously  Home Health: None  Equipment/Devices: None  Discharge Condition:Stable  CODE STATUS: Full  Diet recommendation: Heart Healthy  Brief/Interim Summary: Eddie Bailey is a 48 y.o. male with medical history significant of alcohol dependence, anxiety, chronic pain with opioid dependence who was recently admitted from 6/11 to 6/14 due to chronic cholecystitis/intractable nausea and vomiting.  He underwent cholecystectomy, robot-assisted, laparoscopic, liver biopsy and primary umbilical hernia repair was done prior to discharge.  Patient was placed on CIWA protocol and Librium  was added prior to discharge. On returning home after discharge from the hospital, he had a beer (he drinks 6 to 8, 12 oz beer daily) and started to hallucinate and was confused.  He was brought for further evaluation and was noted to have alcohol level of 119.  CT head with no acute findings noted.  He was overall significantly improved by the time he presented to the ICU and is back to his baseline level of mentation with no further hallucinations.  He is alert and oriented x 3 and CIWA score has remained 0 with no signs of withdrawal noted.  He is otherwise stable for discharge and has been encouraged to pick up his medications as prescribed yesterday.  Discharge Diagnoses:  Principal Problem:   Alcohol withdrawal (HCC) Active Problems:   Alcohol abuse   Abdominal pain   Hypoalbuminemia due to protein-calorie malnutrition (HCC)   Transaminitis   Depression with anxiety  Principal discharge diagnosis:  Confusion/hallucinations possibly related to mild alcohol withdrawal-resolved.  Discharge Instructions  Discharge Instructions     Diet - low sodium heart healthy   Complete by: As directed    Increase activity slowly   Complete by: As directed       Allergies as of 11/28/2023   No Known Allergies      Medication List     TAKE these medications    Buprenorphine  HCl-Naloxone HCl 8-2 MG Film Place 1 Film under the tongue daily at 6 (six) AM. Do not restart until you follow up with pain management physician   busPIRone  10 MG tablet Commonly known as: BUSPAR  Take 10 mg by mouth 3 (three) times daily.   chlordiazePOXIDE  25 MG capsule Commonly known as: LIBRIUM  Take 1 capsule (25 mg total) by mouth 2 (two) times daily.   cloNIDine  0.1 MG tablet Commonly known as: CATAPRES  Take 0.1 mg by mouth daily.   divalproex  500 MG DR tablet Commonly known as: DEPAKOTE  Take 500 mg by mouth daily at 6 (six) AM.   esomeprazole 40 MG capsule Commonly known as: NEXIUM Take 40 mg by mouth every morning.   famotidine 40 MG tablet Commonly known as: PEPCID Take 40 mg by mouth daily.   gabapentin  300 MG capsule Commonly known as: NEURONTIN  Take 300 mg by mouth 2 (two) times daily.   hydrOXYzine  50 MG tablet Commonly known as: ATARAX  Take 25 mg by mouth 3 (three) times daily as needed.   ibuprofen  800 MG tablet Commonly known as: ADVIL  Take 1 tablet (800 mg total) by mouth 3 (three) times daily.   oxyCODONE  5 MG immediate release tablet Commonly known as: Oxy IR/ROXICODONE  Take 1 tablet (5  mg total) by mouth every 6 (six) hours as needed for moderate pain (pain score 4-6).        Follow-up Information     Fredick Jarred, PA-C. Schedule an appointment as soon as possible for a visit in 1 week(s).   Specialty: Family Medicine Contact information: [address] [place] [address] [phone]                No Known  Allergies  Consultations: None   Procedures/Studies: CT HEAD WO CONTRAST ( ) Result Date: 11/28/2023 EXAM: CT HEAD WITHOUT CONTRAST 11/28/2023 12:14:53 AM TECHNIQUE: CT of the head was performed without the administration of intravenous contrast. Automated exposure control, iterative reconstruction, and/or weight based adjustment of the mA/kV was utilized to reduce the radiation dose to as low as reasonably achievable. COMPARISON: None available. CLINICAL HISTORY: Delirium. Pt had surgery last night and was dc from hospital this morning. Pt family now stating that he is hallucinating and talking out of his head. Pt last had a drink 30 min ago. Very lethargic in triage. FINDINGS: BRAIN AND VENTRICLES: There is no acute intracranial hemorrhage, mass effect or midline shift. No abnormal extra-axial fluid collection. The gray-white differentiation is maintained without an acute infarct. There is no hydrocephalus. ORBITS: The visualized portion of the orbits demonstrate no acute abnormality. SINUSES: The visualized paranasal sinuses and mastoid air cells demonstrate no acute abnormality. SOFT TISSUES AND SKULL: No acute abnormality of the visualized skull or soft tissues. IMPRESSION: 1. No acute intracranial abnormality. Electronically signed by: Zadie Herter MD 11/28/2023 12:18 AM EDT RP Workstation: [id number]   DG Chest Port 1 View Result Date: 11/28/2023 CLINICAL DATA:  Altered mental status. EXAM: PORTABLE CHEST 1 VIEW COMPARISON:  None Available. FINDINGS: The heart size and mediastinal contours are within normal limits. Low lung volumes are noted. Mild linear scarring, atelectasis and/or infiltrate is seen within the bilateral lung bases. No pleural effusion or pneumothorax is identified. The visualized skeletal structures are unremarkable. IMPRESSION: Low lung volumes with mild bibasilar linear scarring, atelectasis and/or infiltrate. Electronically Signed   By: Virgle Grime M.D.   On:  11/28/2023 00:06   CT ABDOMEN PELVIS W CONTRAST Result Date: 11/24/2023 CLINICAL DATA:  Abdominal wall hernia. EXAM: CT ABDOMEN AND PELVIS WITH CONTRAST TECHNIQUE: Multidetector CT imaging of the abdomen and pelvis was performed using the standard protocol following bolus administration of intravenous contrast. RADIATION DOSE REDUCTION: This exam was performed according to the departmental dose-optimization program which includes automated exposure control, adjustment of the mA and/or kV according to patient size and/or use of iterative reconstruction technique. CONTRAST:  OMNIPAQUE  IOHEXOL  300 MG/ML  SOLN COMPARISON:  None Available. FINDINGS: Lower chest: No acute abnormality. Hepatobiliary: No focal liver abnormality is seen. No gallstones, gallbladder wall thickening, or biliary dilatation. Pancreas: Unremarkable. No pancreatic ductal dilatation or surrounding inflammatory changes. Spleen: Normal in size without focal abnormality. Adrenals/Urinary Tract: Adrenal glands are unremarkable. Kidneys are normal, without renal calculi, focal lesion, or hydronephrosis. Bladder is unremarkable. Stomach/Bowel: Stomach is within normal limits. Appendix appears normal. No evidence of bowel wall thickening, distention, or inflammatory changes. Vascular/Lymphatic: No significant vascular findings are present. No enlarged abdominal or pelvic lymph nodes. Reproductive: Prostate is unremarkable. Other: Small fat containing left inguinal hernia. Small periumbilical fat containing hernia. No ascites. Musculoskeletal: No acute or significant osseous findings. IMPRESSION: Small fat containing left inguinal hernia. Small fat containing periumbilical hernia. No other abnormality seen in the abdomen or pelvis. Electronically Signed   By: Royston Cornea  Alaine Alken M.D.   On: 11/24/2023 14:24   US  Abdomen Limited RUQ (LIVER/GB) Result Date: 11/24/2023 CLINICAL DATA:  Right upper quadrant pain EXAM: ULTRASOUND ABDOMEN LIMITED RIGHT  UPPER QUADRANT COMPARISON:  None Available. FINDINGS: Gallbladder: Gallbladder is mildly distended. There is a nonshadowing echogenic rounded structure measuring 3 mm. Possible polyp. No shadowing stones. No wall thickening or adjacent fluid. The sonographer reports pain when scanning the gallbladder but the gallbladder is deep to the liver on these images. Common bile duct: Diameter: 5 mm Liver: Diffusely echogenic hepatic parenchyma consistent with fatty liver infiltration. Portal vein is patent on color Doppler imaging with normal direction of blood flow towards the liver. Other: None. IMPRESSION: Fatty liver infiltration. Distended gallbladder with a possible 3 mm polyp. No shadowing stones. The sonographer does report pain when scanning the gallbladder but difficult to confirm this is a Murphy's sign based on these images. Please correlate with clinical presentation. If needed additional workup as clinically appropriate such as HIDA scan. Electronically Signed   By: Adrianna Horde M.D.   On: 11/24/2023 11:57     Discharge Exam: Vitals:   11/28/23 0800 11/28/23 0900  BP: (!) 153/94 (!) 147/98  Pulse: 80 89  Resp: 17 20  Temp:    SpO2: 92% 98%   Vitals:   11/28/23 0700 11/28/23 0725 11/28/23 0800 11/28/23 0900  BP: (!) 151/91  (!) 153/94 (!) 147/98  Pulse: 80  80 89  Resp: 19  17 20   Temp:  98.6 F (37 C)    TempSrc:  Oral    SpO2: 94%  92% 98%  Weight:      Height:        General: Pt is alert, awake, not in acute distress Cardiovascular: RRR, S1/S2 +, no rubs, no gallops Respiratory: CTA bilaterally, no wheezing, no rhonchi Abdominal: Soft, NT, ND, bowel sounds + Extremities: no edema, no cyanosis    The results of significant diagnostics from this hospitalization (including imaging, microbiology, ancillary and laboratory) are listed below for reference.     Microbiology: Recent Results (from the past 240 hours)  Surgical PCR screen     Status: None   Collection Time:  11/24/23  6:38 PM   Specimen: Nasal Mucosa; Nasal Swab  Result Value Ref Range Status   MRSA, PCR NEGATIVE NEGATIVE Final   Staphylococcus aureus NEGATIVE NEGATIVE Final    Comment: (NOTE) The Xpert SA Assay (FDA approved for NASAL specimens in patients 78 years of age and older), is one component of a comprehensive surveillance program. It is not intended to diagnose infection nor to guide or monitor treatment. Performed at [facility], [address], [place], [place] [address]      Labs: BNP (last 3 results) No results for input(s): BNP in the last 8760 hours. Basic Metabolic Panel: Recent Labs  Lab 11/24/23 1113 11/25/23 0436 11/26/23 0428 11/27/23 2313 11/28/23 0020  NA 138 137 137 136  --   K 3.7 3.9 4.2 3.8  --   CL 103 103 101 100  --   CO2 23 26 26 23   --   GLUCOSE 145* 94 131* 90  --   BUN 6 6 15 9   --   CREATININE 0.79 0.85 1.03 0.77  --   CALCIUM 8.5* 8.2* 8.3* 8.3*  --   MG  --   --   --   --  2.2  PHOS  --   --   --   --  4.1  Liver Function Tests: Recent Labs  Lab 11/24/23 1113 11/25/23 0436 11/26/23 0428 11/27/23 2313  AST 120* 94* 100* 138*  ALT 98* 81* 95* 109*  ALKPHOS 102 96 105 129*  BILITOT 0.5 1.1 1.2 0.7  PROT 6.4* 5.7* 5.9* 6.3*  ALBUMIN 3.2* 2.8* 2.8* 3.0*   Recent Labs  Lab 11/24/23 1113  LIPASE 42   Recent Labs  Lab 11/28/23 0020  AMMONIA 18   CBC: Recent Labs  Lab 11/24/23 1113 11/25/23 0436 11/26/23 0428 11/27/23 2313  WBC 4.5 4.8 10.2 5.2  HGB 14.7 13.5 13.8 13.0  HCT 43.0 40.4 40.0 38.5*  MCV 91.3 91.2 91.5 92.3  PLT 315 199 300 283   Cardiac Enzymes: No results for input(s): CKTOTAL, CKMB, CKMBINDEX, TROPONINI in the last 168 hours. BNP: Invalid input(s): POCBNP CBG: No results for input(s): GLUCAP in the last 168 hours. D-Dimer No results for input(s): DDIMER in the last 72 hours. Hgb A1c No results for input(s): HGBA1C in the last 72 hours. Lipid Profile No results for input(s):  CHOL, HDL, LDLCALC, TRIG, CHOLHDL, LDLDIRECT in the last 72 hours. Thyroid function studies Recent Labs    11/26/23 0428  TSH 0.662   Anemia work up Recent Labs    11/26/23 0428  VITAMINB12 299  FOLATE 10.5   Urinalysis    Component Value Date/Time   COLORURINE YELLOW 11/24/2023 1100   APPEARANCEUR CLEAR 11/24/2023 1100   LABSPEC 1.020 11/24/2023 1100   PHURINE 6.0 11/24/2023 1100   GLUCOSEU 50 (A) 11/24/2023 1100   HGBUR NEGATIVE 11/24/2023 1100   BILIRUBINUR NEGATIVE 11/24/2023 1100   KETONESUR NEGATIVE 11/24/2023 1100   PROTEINUR 30 (A) 11/24/2023 1100   NITRITE NEGATIVE 11/24/2023 1100   LEUKOCYTESUR NEGATIVE 11/24/2023 1100   Sepsis Labs Recent Labs  Lab 11/24/23 1113 11/25/23 0436 11/26/23 0428 11/27/23 2313  WBC 4.5 4.8 10.2 5.2   Microbiology Recent Results (from the past 240 hours)  Surgical PCR screen     Status: None   Collection Time: 11/24/23  6:38 PM   Specimen: Nasal Mucosa; Nasal Swab  Result Value Ref Range Status   MRSA, PCR NEGATIVE NEGATIVE Final   Staphylococcus aureus NEGATIVE NEGATIVE Final    Comment: (NOTE) The Xpert SA Assay (FDA approved for NASAL specimens in patients 19 years of age and older), is one component of a comprehensive surveillance program. It is not intended to diagnose infection nor to guide or monitor treatment. Performed at [facility], [address] [place], [place] [address]      Time coordinating discharge: 35 minutes  SIGNED:   Cornelius Dill, DO [facility] 11/28/2023, 9:47 AM  If 7PM-7AM, please contact night-coverage [email or web address]

## 2023-11-28 NOTE — ED Provider Notes (Signed)
 [facility]-EMERGENCY DEPT [facility] Emergency Department Provider Note MRN:  [id number]  Arrival date & time: 11/28/23     Chief Complaint   Hallucinations   History of Present Illness   Eddie Bailey is a 48 y.o. year-old male with a history of alcohol use disorder, hypertension presenting to the ED with chief complaint of hallucinations.  Got home from the hospital today.  Had a hernia surgery.  Was in the hospital for about 3 days.  Went without alcohol during this hospitalization which is very abnormal for him.  Has never withdrawn but has also never given himself the chance to withdrawal.  Drinks every day.  Went home and immediately started drinking alcohol.  Only able to have about 1 alcoholic beverage at home.  Began hallucinating, feeling very confused in the head.  Was talking to his daughter on the way here in the car but daughter was not in the car per wife.  Review of Systems  I was unable to obtain a full/accurate HPI, PMH, or ROS due to the patient's altered mental status.  Patient's Health History    Past Medical History:  Diagnosis Date   Alcohol abuse    10 years   Anxiety    History of substance use    Hypertension     Past Surgical History:  Procedure Laterality Date   DIAGNOSTIC LAPAROSCOPIC LIVER BIOPSY  11/25/2023   Procedure: BIOPSY, LIVER, ROBOT-ASSISTED, LAPAROSCOPIC;  Surgeon: Awilda Bogus, MD;  Location: [facility] ORS;  Service: General;;   HERNIA REPAIR     inguinal   UMBILICAL HERNIA REPAIR  11/25/2023   Procedure: PRIMARY REPAIR, HERNIA, UMBILICAL, ADULT;  Surgeon: Awilda Bogus, MD;  Location: [facility] ORS;  Service: General;;    History reviewed. No pertinent family history.  Social History   Socioeconomic History   Marital status: Married    Spouse name: Not on file   Number of children: Not on file   Years of education: Not on file   Highest education level: Not on file  Occupational History   Not on file  Tobacco Use   Smoking status:  Every Day    Current packs/day: 1.00    Types: Cigarettes   Smokeless tobacco: Never  Vaping Use   Vaping status: Never Used  Substance and Sexual Activity   Alcohol use: Yes    Alcohol/week: 12.0 standard drinks of alcohol    Types: 12 Cans of beer per week    Comment: daily   Drug use: Not Currently   Sexual activity: Yes  Other Topics Concern   Not on file  Social History Narrative   Not on file   Social Drivers of Health   Financial Resource Strain: Not on file  Food Insecurity: No Food Insecurity (11/24/2023)   Hunger Vital Sign    Worried About Running Out of Food in the Last Year: Never true    Ran Out of Food in the Last Year: Never true  Transportation Needs: No Transportation Needs (11/24/2023)   PRAPARE - Administrator, Civil Service (Medical): No    Lack of Transportation (Non-Medical): No  Physical Activity: Not on file  Stress: Not on file  Social Connections: Not on file  Intimate Partner Violence: Not At Risk (11/24/2023)   Humiliation, Afraid, Rape, and Kick questionnaire    Fear of Current or Ex-Partner: No    Emotionally Abused: No    Physically Abused: No    Sexually Abused: No  Physical Exam   Vitals:   11/27/23 2345 11/28/23 0110  BP:  120/82  Pulse: 100 91  Resp: 18 14  Temp:    SpO2: 93% 94%    CONSTITUTIONAL: Well-appearing, NAD NEURO/PSYCH: Somnolent, wakes easily, oriented to name and place, slow to answer questions EYES:  eyes equal and reactive ENT/NECK:  no LAD, no JVD CARDIO: Regular rate, well-perfused, normal S1 and S2 PULM:  CTAB no wheezing or rhonchi GI/GU:  non-distended, non-tender MSK/SPINE:  No gross deformities, no edema SKIN:  no rash, atraumatic   *Additional and/or pertinent findings included in MDM below  Diagnostic and Interventional Summary    EKG Interpretation Date/Time:  Saturday November 27 2023 22:50:09 EDT Ventricular Rate:  100 PR Interval:  127 QRS Duration:  100 QT Interval:  369 QTC  Calculation: 476 R Axis:   -2  Text Interpretation: Sinus tachycardia RSR' in V1 or V2, right VCD or RVH Left ventricular hypertrophy Borderline prolonged QT interval Confirmed by Gwenetta Lennert [phone]) on 11/28/2023 12:16:04 AM       Labs Reviewed  CBC - Abnormal; Notable for the following components:      Result Value   RBC 4.17 (*)    HCT 38.5 (*)    All other components within normal limits  COMPREHENSIVE METABOLIC PANEL WITH GFR - Abnormal; Notable for the following components:   Calcium 8.3 (*)    Total Protein 6.3 (*)    Albumin 3.0 (*)    AST 138 (*)    ALT 109 (*)    Alkaline Phosphatase 129 (*)    All other components within normal limits  ETHANOL - Abnormal; Notable for the following components:   Alcohol, Ethyl (B) 119 (*)    All other components within normal limits  PROTIME-INR  AMMONIA    CT HEAD WO CONTRAST ( )  Final Result    DG Chest Port 1 View  Final Result      Medications  LORazepam  (ATIVAN ) injection 1 mg (1 mg Intravenous Given 11/27/23 2355)  sodium chloride  0.9 % bolus 1,000 mL (1,000 mLs Intravenous New Bag/Given 11/27/23 2352)  LORazepam  (ATIVAN ) injection 2 mg (2 mg Intravenous Given 11/28/23 0109)     Procedures  /  Critical Care .Critical Care  Performed by: Edson Graces, MD Authorized by: Edson Graces, MD   Critical care provider statement:    Critical care time (minutes):  45   Critical care was necessary to treat or prevent imminent or life-threatening deterioration of the following conditions:  CNS failure or compromise   Critical care was time spent personally by me on the following activities:  Development of treatment plan with patient or surrogate, discussions with consultants, evaluation of patient's response to treatment, examination of patient, ordering and review of laboratory studies, ordering and review of radiographic studies, ordering and performing treatments and interventions, pulse oximetry, re-evaluation of  patient's condition and review of old charts   ED Course and Medical Decision Making  Initial Impression and Ddx Initial concern for DTs given the history, could also be hepatic encephalopathy, electrolyte disturbance, cranial mass or bleeding.  Past medical/surgical history that increases complexity of ED encounter: Alcohol use disorder  Interpretation of Diagnostics I personally reviewed the Laboratory Testing and my interpretation is as follows: No significant blood count or electrolyte disturbance.  CT head unremarkable  Patient Reassessment and Ultimate Disposition/Management     Continued confusion/mild agitation requiring Ativan , will request hospitalist admission.  Patient management required discussion with the  following services or consulting groups:  Hospitalist Service  Complexity of Problems Addressed Acute illness or injury that poses threat of life of bodily function  Additional Data Reviewed and Analyzed Further history obtained from: Further history from spouse/family member  Additional Factors Impacting ED Encounter Risk Use of parenteral controlled substances and Consideration of hospitalization  Merrick Abe. Harless Lien, MD [facility] Emergency Medicine [facility] [email or web address]  Final Clinical Impressions(s) / ED Diagnoses     ICD-10-CM   1. Delirium tremens (HCC)  Z61.096       ED Discharge Orders     None        Discharge Instructions Discussed with and Provided to Patient:   Discharge Instructions   None      Edson Graces, MD 11/28/23 0145

## 2023-11-28 NOTE — H&P (Signed)
 History and Physical    Patient: Eddie Bailey ZOX:[id number] DOB: May 11, 1976 DOA: 11/27/2023 DOS: the patient was seen and examined on 11/28/2023 PCP: Fredick Jarred, PA-C  Patient coming from: Home  Chief Complaint:  Chief Complaint  Patient presents with   Hallucinations   HPI: Eddie Bailey is a 48 y.o. male with medical history significant of alcohol dependence, anxiety, chronic pain with opioid dependence who was recently admitted from 6/11 to 6/14 due to chronic cholecystitis/intractable nausea and vomiting.  He underwent cholecystectomy, robot-assisted, laparoscopic, liver biopsy and primary umbilical hernia repair was done prior to discharge.  Patient was placed on CIWA protocol and Librium  was added prior to discharge. On returning home after discharge from the hospital, he had a beer (he drinks 6 to 8, 12 oz beer daily) and started to hallucinate and was confused.  Wife took him back to the ED and en route to the ED, he was hallucinating by talking to his daughter in the car (though, daughter was not in the car).  He complained of abdominal pain in the periumbilical surgical area.   ED Course:  In the emergency department, he was tachypneic with respiratory rate of 25/min, pulse 112 bpm, other vital signs are within normal range.  Workup in the ED showed normocytic anemia.  BMP was normal.  Albumin 3.0, AST 138, ALT 109, ALP 129, ammonia 18. CT head without contrast showed no acute intracranial abnormality Chest x-ray showed low lung volumes with mild bibasilar linear scarring, atelectasis and/or infiltrate. IV lorazepam  was given in the ED and IV NS 1 L was provided.  [facility] was asked to admit patient  Review of Systems: Review of systems as noted in the HPI. All other systems reviewed and are negative.   Past Medical History:  Diagnosis Date   Alcohol abuse    10 years   Anxiety    History of substance use    Hypertension    Past Surgical History:  Procedure Laterality Date    DIAGNOSTIC LAPAROSCOPIC LIVER BIOPSY  11/25/2023   Procedure: BIOPSY, LIVER, ROBOT-ASSISTED, LAPAROSCOPIC;  Surgeon: Awilda Bogus, MD;  Location: [facility];  Service: General;;   HERNIA REPAIR     inguinal   UMBILICAL HERNIA REPAIR  11/25/2023   Procedure: PRIMARY REPAIR, HERNIA, UMBILICAL, ADULT;  Surgeon: Awilda Bogus, MD;  Location: [facility];  Service: General;;    Social History:  reports that he has been smoking cigarettes. He has never used smokeless tobacco. He reports current alcohol use of about 12.0 standard drinks of alcohol per week. He reports that he does not currently use drugs.   No Known Allergies  History reviewed. No pertinent family history.   Prior to Admission medications   Medication Sig Start Date End Date Taking? Authorizing Provider  Buprenorphine  HCl-Naloxone HCl 8-2 MG FILM Place 1 Film under the tongue daily at 6 (six) AM. Do not restart until you follow up with pain management physician 11/27/23   Tat, Myrtie Atkinson, MD  busPIRone  (BUSPAR ) 10 MG tablet Take 10 mg by mouth 3 (three) times daily. 10/05/23   [provider]  chlordiazePOXIDE  (LIBRIUM ) 25 MG capsule Take 1 capsule (25 mg total) by mouth 2 (two) times daily. 11/27/23   Demaris Fillers, MD  cloNIDine  (CATAPRES ) 0.1 MG tablet Take 0.1 mg by mouth daily. 08/16/23   [provider]  divalproex  (DEPAKOTE ) 500 MG DR tablet Take 500 mg by mouth daily at 6 (six) AM. 08/16/23   [provider]  esomeprazole (  NEXIUM) 40 MG capsule Take 40 mg by mouth every morning. 09/21/23   [provider]  famotidine (PEPCID) 40 MG tablet Take 40 mg by mouth daily. 10/05/23   [provider]  gabapentin  (NEURONTIN ) 300 MG capsule Take 300 mg by mouth 2 (two) times daily. 08/16/23   [provider]  hydrOXYzine  (ATARAX ) 50 MG tablet Take 25 mg by mouth 3 (three) times daily as needed. 09/14/23   [provider]  ibuprofen  (ADVIL ) 800 MG tablet Take 1 tablet (800 mg total) by mouth 3  (three) times daily. 04/19/19   Triplett, Tammy, PA-C  oxyCODONE  (OXY IR/ROXICODONE ) 5 MG immediate release tablet Take 1 tablet (5 mg total) by mouth every 6 (six) hours as needed for moderate pain (pain score 4-6). 11/27/23   Demaris Fillers, MD    Physical Exam: BP (!) 153/94   Pulse 80   Temp 98.6 F (37 C) (Oral)   Resp 17   Ht 6' (1.829 m)   Wt 88.7 kg   SpO2 92%   BMI 26.52 kg/m   General: 48 y.o. year-old male well developed well nourished in no acute distress.  Alert and oriented x3. HEENT: NCAT, EOMI Neck: Supple, trachea medial Cardiovascular: Regular rate and rhythm with no rubs or gallops.  No thyromegaly or JVD noted.  No lower extremity edema. 2/4 pulses in all 4 extremities. Respiratory: Clear to auscultation with no wheezes or rales. Good inspiratory effort. Abdomen: Soft, tender to palpation in the periumbilical surgical area.  Nondistended with normal bowel sounds x4 quadrants. Muskuloskeletal: No cyanosis, clubbing or edema noted bilaterally Neuro: CN II-XII intact, strength 5/5 x 4, sensation, reflexes intact Skin: No ulcerative lesions noted.  Skin warm and dry Psychiatry: Mood is appropriate for condition and setting          Labs on Admission:  Basic Metabolic Panel: Recent Labs  Lab 11/24/23 1113 11/25/23 0436 11/26/23 0428 11/27/23 2313  NA 138 137 137 136  K 3.7 3.9 4.2 3.8  CL 103 103 101 100  CO2 23 26 26 23   GLUCOSE 145* 94 131* 90  BUN 6 6 15 9   CREATININE 0.79 0.85 1.03 0.77  CALCIUM 8.5* 8.2* 8.3* 8.3*   Liver Function Tests: Recent Labs  Lab 11/24/23 1113 11/25/23 0436 11/26/23 0428 11/27/23 2313  AST 120* 94* 100* 138*  ALT 98* 81* 95* 109*  ALKPHOS 102 96 105 129*  BILITOT 0.5 1.1 1.2 0.7  PROT 6.4* 5.7* 5.9* 6.3*  ALBUMIN 3.2* 2.8* 2.8* 3.0*   Recent Labs  Lab 11/24/23 1113  LIPASE 42   Recent Labs  Lab 11/28/23 0020  AMMONIA 18   CBC: Recent Labs  Lab 11/24/23 1113 11/25/23 0436 11/26/23 0428 11/27/23 2313   WBC 4.5 4.8 10.2 5.2  HGB 14.7 13.5 13.8 13.0  HCT 43.0 40.4 40.0 38.5*  MCV 91.3 91.2 91.5 92.3  PLT 315 199 300 283   Cardiac Enzymes: No results for input(s): CKTOTAL, CKMB, CKMBINDEX, TROPONINI in the last 168 hours.  BNP (last 3 results) No results for input(s): BNP in the last 8760 hours.  ProBNP (last 3 results) No results for input(s): PROBNP in the last 8760 hours.  CBG: No results for input(s): GLUCAP in the last 168 hours.  Radiological Exams on Admission: CT HEAD WO CONTRAST ( ) Result Date: 11/28/2023 EXAM: CT HEAD WITHOUT CONTRAST 11/28/2023 12:14:53 AM TECHNIQUE: CT of the head was performed without the administration of intravenous contrast. Automated exposure control, iterative reconstruction, and/or  weight based adjustment of the mA/kV was utilized to reduce the radiation dose to as low as reasonably achievable. COMPARISON: None available. CLINICAL HISTORY: Delirium. Pt had surgery last night and was dc from hospital this morning. Pt family now stating that he is hallucinating and talking out of his head. Pt last had a drink 30 min ago. Very lethargic in triage. FINDINGS: BRAIN AND VENTRICLES: There is no acute intracranial hemorrhage, mass effect or midline shift. No abnormal extra-axial fluid collection. The gray-white differentiation is maintained without an acute infarct. There is no hydrocephalus. ORBITS: The visualized portion of the orbits demonstrate no acute abnormality. SINUSES: The visualized paranasal sinuses and mastoid air cells demonstrate no acute abnormality. SOFT TISSUES AND SKULL: No acute abnormality of the visualized skull or soft tissues. IMPRESSION: 1. No acute intracranial abnormality. Electronically signed by: Zadie Herter MD 11/28/2023 12:18 AM EDT RP Workstation: [id number]   DG Chest Port 1 View Result Date: 11/28/2023 CLINICAL DATA:  Altered mental status. EXAM: PORTABLE CHEST 1 VIEW COMPARISON:  None Available. FINDINGS: The  heart size and mediastinal contours are within normal limits. Low lung volumes are noted. Mild linear scarring, atelectasis and/or infiltrate is seen within the bilateral lung bases. No pleural effusion or pneumothorax is identified. The visualized skeletal structures are unremarkable. IMPRESSION: Low lung volumes with mild bibasilar linear scarring, atelectasis and/or infiltrate. Electronically Signed   By: Virgle Grime M.D.   On: 11/28/2023 00:06    EKG: I independently viewed the EKG done and my findings are as followed: Sinus tachycardia at a rate of 100 bpm.  Assessment/Plan Present on Admission:  Alcohol withdrawal (HCC)  Alcohol abuse  Principal Problem:   Alcohol withdrawal (HCC) Active Problems:   Alcohol abuse   Abdominal pain   Hypoalbuminemia due to protein-calorie malnutrition (HCC)   Transaminitis   Depression with anxiety  Alcohol abuse/withdrawal Alcohol level was 119 Continue CIWA protocol, folic acid , thiamine  and multivitamin Continue fall precaution and neuro checks Patient will be counseled on alcohol withdrawal cessation when more stable  Abdominal pain Pain appears to be from the abdominal wall and related to recent cholecystectomy Continue oxycodone  as needed  Hypoalbuminemia possibly secondary to moderate protein calorie malnutrition Albumin 3.0, protein supplement will be provided  Transaminitis AST 138, ALT 109, ALP 129 This may be related to recent cholecystectomy procedure Continue to monitor liver enzymes  Tobacco abuse Patient was counseled on tobacco abuse cessation  Anxiety/depression Continue hydroxyzine , Depakote , Celexa, buspar    DVT prophylaxis: Lovenox  Code Status: Full code  Family Communication: None at bedside  Consults: None  Severity of Illness: The appropriate patient status for this patient is INPATIENT. Inpatient status is judged to be reasonable and necessary in order to provide the required intensity of service  to ensure the patient's safety. The patient's presenting symptoms, physical exam findings, and initial radiographic and laboratory data in the context of their chronic comorbidities is felt to place them at high risk for further clinical deterioration. Furthermore, it is not anticipated that the patient will be medically stable for discharge from the hospital within 2 midnights of admission.   * I certify that at the point of admission it is my clinical judgment that the patient will require inpatient hospital care spanning beyond 2 midnights from the point of admission due to high intensity of service, high risk for further deterioration and high frequency of surveillance required.*  Author: Ranger Petrich, DO 11/28/2023 8:27 AM  For on call review [email or web address].

## 2023-12-01 NOTE — Progress Notes (Signed)
 Noted

## 2023-12-01 NOTE — Progress Notes (Signed)
 Pt is scheduled with Antony Baumgartner B in 6 weeks.

## 2023-12-21 ENCOUNTER — Ambulatory Visit (INDEPENDENT_AMBULATORY_CARE_PROVIDER_SITE_OTHER): Admitting: General Surgery

## 2023-12-21 ENCOUNTER — Other Ambulatory Visit: Payer: Self-pay

## 2023-12-21 ENCOUNTER — Encounter: Payer: Self-pay | Admitting: General Surgery

## 2023-12-21 VITALS — BP 151/100 | HR 84 | Temp 98.9°F | Resp 18 | Ht 72.0 in | Wt 198.0 lb

## 2023-12-21 DIAGNOSIS — K760 Fatty (change of) liver, not elsewhere classified: Secondary | ICD-10-CM

## 2023-12-21 DIAGNOSIS — K824 Cholesterolosis of gallbladder: Secondary | ICD-10-CM

## 2023-12-21 NOTE — Patient Instructions (Addendum)
 Continue to increase activity with time.  Try to avoid/ limit lifting over 10 lbs, until 4-6 weeks out from surgery to help with reducing umbilical recurrence.

## 2023-12-21 NOTE — Progress Notes (Signed)
 [facility]  Doing well. No issues.  BP (!) 151/100 (BP Location: Right Arm, Patient Position: Sitting, Cuff Size: Normal)   Pulse 84   Temp 98.9 F (37.2 C) (Oral)   Resp 18   Ht 6' (1.829 m)   Wt 198 lb (89.8 kg)   SpO2 96%   BMI 26.85 kg/m  Soft, nondistened, nontender, port sites and umbilical hernia repair c/d/I and glue peelling, suture spitting in umbilicus, trimmed  Patient sp robotic cholecystectomy, liver biopsy and umbilical hernia repair. Doing well.   Continue to increase activity with time.  Try to avoid/ limit lifting over 10 lbs, until 4-6 weeks out from surgery to help with reducing umbilical recurrence.  Manuelita Pander, MD [facility] [address] [place], [place] [address] [phone] (office)

## 2023-12-28 NOTE — ED Provider Notes (Signed)
 Emergency Department Provider Note    ED Clinical Impression   Final diagnoses:  Abdominal pain, unspecified abdominal location (Primary)  Reactive lymphadenopathy  Viral syndrome    ED Assessment/Plan    Condition: Stable Disposition: Discharge  This chart has been completed using Dragon Medical Dictation software, and while attempts have been made to ensure accuracy, certain words and phrases may not be transcribed as intended.   History   Chief Complaint  Patient presents with  . Abdominal Pain   HPI  Eddie Bailey is a 48 y.o. male  who presents today to the  emergency department complaining of abdominal pain, nausea, vomiting, chills and swollen lymph nodes.  Patient states that symptoms have been going for the past day or so.  He is about a month postop cholecystectomy.  He has been doing well until recently.  He describes his pain as moderate.  He denies any diarrhea or constipation.  Denies any cough or congestion.  There are no obvious aggravating or  relieving factors.    Allergies: has no known allergies. Medications: has a current medication list which includes the following long-term medication(s): citalopram, clonidine  hcl, divalproex , and gabapentin . PMHx:  has a past medical history of Alcohol abuse. PSHx:  has a past surgical history that includes Hernia repair and Cholecystectomy. SocHx:  reports that he has quit smoking. His smoking use included cigarettes. He has never used smokeless tobacco. He reports current alcohol use. He reports that he does not currently use drugs. Allergies, Medications, Medical, Surgical, and Social History were reviewed as documented above.   Social Drivers of Health with Concerns   Tobacco Use: Medium Risk (12/28/2023)   Patient History   . Smoking Tobacco Use: Former   . Smokeless Tobacco Use: Never   . Passive Exposure: Not on file   Alcohol Use: Not on file  Housing: Not on file  Physical Activity: Not on file  Stress: Not on file  Interpersonal Safety: Not on file  Substance Use: Not on file (09/22/2023)  Social Connections: Not on file  Financial Resource Strain: Not on file  Health Literacy: Not on file  Internet Connectivity: Not on file     Review Of Systems  Review of Systems  Constitutional:  Positive for chills. Negative for fever.  HENT:  Negative for congestion.   Respiratory:  Negative for chest tightness and shortness of breath.   Cardiovascular:  Negative for chest pain.  Gastrointestinal:  Positive for abdominal pain, nausea and vomiting.  Skin:  Negative for color change.  Psychiatric/Behavioral:  Negative for behavioral problems.   All other systems reviewed and are negative.   Physical Exam   BP 128/79   Pulse 87   Temp 36.8 C (98.3 F) (Oral)   Resp 19   Ht 182.9 cm (6')   Wt 92.1 kg (203 lb)   SpO2 96%   BMI 27.53 kg/m   Physical Exam Vitals and nursing note reviewed.  Constitutional:      General: He is not in acute distress. HENT:     Head: Normocephalic.     Mouth/Throat:     Pharynx: No posterior oropharyngeal erythema.  Eyes:     Conjunctiva/sclera: Conjunctivae normal.  Neck:     Comments: There is some anterior lymphadenopathy. Cardiovascular:  Rate and Rhythm: Regular rhythm.     Pulses: Normal pulses.     Heart sounds: Normal heart sounds.  Pulmonary:     Effort: No respiratory distress.     Breath sounds: Normal breath sounds.  Abdominal:     General: There is no distension.     Tenderness: There is abdominal tenderness. There is no guarding or rebound.     Comments: Slight epigastric and right upper quadrant tenderness.  No guarding or rebound.  Normoactive bowel sounds.  Healing surgical scars.  Musculoskeletal:        General: No deformity.  Lymphadenopathy:     Cervical: Cervical adenopathy present.  Skin:    General: Skin is warm.      Capillary Refill: Capillary refill takes 2 to 3 seconds.     Comments: Normal cap refill.  Neurological:     General: No focal deficit present.     Mental Status: He is oriented to person, place, and time.  Psychiatric:        Mood and Affect: Mood normal.     ED Course  Medical Decision Making Differential diagnosis includes postoperative pain versus postoperative complication such as bowel obstruction versus abscess versus less likely  , Versus viral syndrome such as influenza versus strep versus mono given lymphadenopathy.  Will do basic workup.  Will get CT scan.  10:30 PM Patient reevaluated.  Patient is doing well.  Labs and imaging reviewed and discussed.  He is stable for discharge.  I have reviewed my clinical findings and studies and my clinical impression with the patient. The patient has expressed understanding that at this time there is no evidence for a more malignant underlying process, but the patient also understands that early in the process of a condition such as this, an initial workup can be falsely reassuring. I have counseled the patient and discussed follow-up with the patient, stressing the importance of appropriate follow-up. I have also counseled the patient to return if worse or any concerns. Routine discharge counseling was given to the patient and the patient understands that worsening, changing or persistent symptoms should prompt an immediate call or follow up with their primary physician or return to the emergency department for reevaluation. Patient has expressed understanding.     Problems Addressed: Abdominal pain, unspecified abdominal location: acute illness or injury that poses a threat to life or bodily functions Reactive lymphadenopathy: acute illness or injury that poses a threat to life or bodily functions Viral syndrome: acute illness or injury  Amount and/or Complexity of Data Reviewed Labs: ordered. Decision-making details documented in ED  Course. Radiology: ordered and independent interpretation performed. Decision-making details documented in ED Course.  Risk Prescription drug management. Decision regarding hospitalization.     Procedures   No results found for this visit on 12/28/23 (from the past 4464 hours).   ED Results Results for orders placed or performed during the hospital encounter of 12/28/23  Influenza/ RSV/COVID PCR   Specimen: Nasopharyngeal Swab  Result Value Ref Range   SARS-CoV-2 PCR Negative Negative   Influenza A Negative Negative   Influenza B Negative Negative   RSV Negative Negative  Strep Group A Rapid   Specimen: Throat  Result Value Ref Range   Rapid Group A Strep PCR Not Detected Not Detected  Lipase Level  Result Value Ref Range   Lipase 37 16 - 77 U/L  Magnesium Level  Result Value Ref Range   Magnesium 2.0 1.6 - 2.6 mg/dL  Comprehensive  Metabolic Panel  Result Value Ref Range   Sodium 135 135 - 145 mmol/L   Potassium 4.3 3.5 - 5.0 mmol/L   Chloride 98 98 - 107 mmol/L   CO2 26.2 21.0 - 32.0 mmol/L   Anion Gap 11 3 - 11 mmol/L   BUN 9 8 - 20 mg/dL   Creatinine 8.95 9.19 - 1.30 mg/dL   BUN/Creatinine Ratio 9    eGFR CKD-EPI (2021) Male 90 >=60 mL/min/1.4m2   Glucose 96 70 - 179 mg/dL   Calcium 8.6 8.5 - 89.8 mg/dL   Albumin 3.5 3.5 - 5.0 g/dL   Total Protein 7.2 6.0 - 8.0 g/dL   Total Bilirubin 0.6 0.3 - 1.2 mg/dL   AST 56 (H) 15 - 40 U/L   ALT 78 12 - 78 U/L   Alkaline Phosphatase 205 (H) 46 - 116 U/L  Mononucleosis screen  Result Value Ref Range   Infectious Mono Negative Negative  CBC w/ Differential  Result Value Ref Range   WBC 5.9 4.0 - 10.5 10*9/L   RBC 4.60 4.10 - 5.60 10*12/L   HGB 14.1 12.5 - 17.0 g/dL   HCT 60.4 63.9 - 49.9 %   MCV 85.9 80.0 - 98.0 fL   MCH 30.7 27.0 - 34.0 pg   MCHC 35.7 32.0 - 36.0 g/dL   RDW 87.6 88.4 - 85.4 %   MPV 8.7 7.4 - 10.4 fL   Platelet 257 140 - 415 10*9/L   Neutrophils % 51.6 %   Lymphocytes % 28.7 %   Monocytes %  15.5 %   Eosinophils % 2.2 %   Basophils % 1.5 %   Absolute Neutrophils 3.0 1.8 - 7.8 10*9/L   Absolute Lymphocytes 1.7 0.7 - 4.5 10*9/L   Absolute Monocytes 0.9 0.1 - 1.0 10*9/L   Absolute Eosinophils 0.1 0.0 - 0.4 10*9/L   Absolute Basophils 0.1 0.0 - 0.2 10*9/L  Urinalysis with Microscopy with Culture Reflex  Result Value Ref Range   Color, UA Colorless    Clarity, UA Clear Clear   Specific Gravity, UA 1.002 (L) 1.010 - 1.025   pH, UA 5.5 5.0 - 8.0   Leukocyte Esterase, UA Negative Negative   Nitrite, UA Negative Negative   Protein, UA Negative Negative   Glucose, UA Negative Negative, Trace   Ketones, UA Negative Negative   Urobilinogen, UA <2.0 mg/dL <7.9 mg/dL   Bilirubin, UA Negative Negative   Blood, UA Negative Negative   RBC, UA <1 0 - 3 /HPF   WBC, UA <1 0 - 3 /HPF   Squam Epithel, UA 0 0 - 10 /HPF   Bacteria, UA None Seen None Seen /HPF   WBC Clumps None Seen None Seen /HPF   Hyphal Yeast None Seen None Seen /HPF   Yeast, UA None Seen None Seen /HPF   CT Abdomen Pelvis W Contrast Result Date: 12/28/2023 Exam:  CT of the Abdomen and Pelvis with Contrast  History:  Epigastric pain. Right upper quadrant pain.  Technique:  Routine CT of the abdomen and pelvis with IV contrast. AEC (automated exposure control) and/or manual techniques such as size-specific kV and mAs are employed where appropriate to reduce radiation exposure for all CT exams.  Comparison:  09/21/2023  Abdomen and Pelvis CT Findings:  LOWER THORAX:  Mild atelectasis.  The heart size is normal.   HEPATOBILIARY:  The hepatic veins and portal veins are patent.    The gallbladder is surgically absent.  Unchanged ectasia of the extrahepatic  ducts.  PANCREAS:  Mild ectasia of the pancreatic duct. Uniform pancreatic parenchymal enhancement. No peripancreatic inflammatory stranding.  SPLEEN:  The spleen is within normal limits for size. The splenic vein is patent.  ADRENALS:  Suspected motion artifact accounting for the  apparent thickening of the lateral limb of the right adrenal gland. Left adrenal gland appears normal.  KIDNEYS/URETERS:  No calcified renal stones.  No hydronephrosis. Normal renal parenchymal enhancement.  VASCULAR:  The abdominal aorta and inferior vena cava are of normal course and external caliber.  Mild calcified atherosclerotic plaquing of the abdominal aorta. Retroaortic left renal vein.  LYMPH NODES:  No retroperitoneal, intraperitoneal, or pelvic lymphadenopathy.  BOWEL/MESENTERY:  The stomach is decompressed. The duodenal sweep crosses midline.  No evidence of a bowel obstruction. No intraperitoneal free air or fluid.  The appendix is not well seen, however there are no secondary signs of acute appendicitis.  PELVIC ORGANS:  No calcified urinary bladder stone. The prostate is within normal limits for size.  BONES/SOFT TISSUES:  Prominent adipose tissue within the left inguinal canal. No destructive osseous lesion.    The gallbladder is surgically absent. Unchanged ectasia of the extrahepatic ducts. Correlation with liver function tests is recommended.  Signed (Electronic Signature): 12/28/2023 10:19 PM Signed By: Lynwood Vaughan Calkins, MD   Medications Administered:  Medications  ondansetron  (ZOFRAN ) injection 4 mg (has no administration in time range)  sodium chloride  0.9% (NS) bolus 1,000 mL (1,000 mL Intravenous New Bag 12/28/23 2117)  ondansetron  (ZOFRAN ) injection 4 mg (4 mg Intravenous Given 12/28/23 2116)  iohexol  (OMNIPAQUE ) 300 mg iodine/mL solution 125 mL (125 mL Intravenous Given 12/28/23 2210)    Discharge Medications (Medications Prescribed during this  ED visit and Patient's Home Medications) :    Your Medication List     START taking these medications    ondansetron  4 MG disintegrating tablet Commonly known as: ZOFRAN -ODT Take 1 tablet (4 mg total) by mouth every eight (8) hours as needed for nausea for up to 7 days.       ASK your doctor about these medications     BRIXADI  injection Generic drug: buprenorphine  Inject 0.36 mL (128 mg total) under the skin every thirty (30) days.   citalopram 20 MG tablet Commonly known as: CeleXA Take 1 tablet (20 mg total) by mouth daily.   cloNIDine  HCL 0.1 MG tablet Commonly known as: CATAPRES  Take 1 tablet (0.1 mg total) by mouth daily.   divalproex  500 MG DR tablet Commonly known as: DEPAKOTE  Take 1 tablet (500 mg total) by mouth two (2) times a day.   gabapentin  300 MG capsule Commonly known as: NEURONTIN  Take 1 capsule (300 mg total) by mouth two (2) times a day.   hydrOXYzine  50 MG tablet Commonly known as: ATARAX  Take 0.5 tablets (25 mg total) by mouth two (2) times a day as needed for anxiety.          Cherie Ardeen Hanger, MD 12/28/23 2231

## 2023-12-28 NOTE — ED Triage Notes (Signed)
 Patient here from home for evaluation of abdomen pain, N/V, chills and bilateral neck lymph node swelling. Recent gallbladder surgery, cleared post-op appointment on Tuesday.

## 2024-01-12 ENCOUNTER — Other Ambulatory Visit: Payer: Self-pay | Admitting: *Deleted

## 2024-01-12 ENCOUNTER — Ambulatory Visit (INDEPENDENT_AMBULATORY_CARE_PROVIDER_SITE_OTHER): Admitting: Gastroenterology

## 2024-01-12 ENCOUNTER — Telehealth: Payer: Self-pay | Admitting: *Deleted

## 2024-01-12 ENCOUNTER — Encounter: Payer: Self-pay | Admitting: Gastroenterology

## 2024-01-12 ENCOUNTER — Other Ambulatory Visit (HOSPITAL_COMMUNITY)
Admission: RE | Admit: 2024-01-12 | Discharge: 2024-01-12 | Disposition: A | Discharge status: Home / Self Care | Source: Ambulatory Visit | Attending: Gastroenterology | Admitting: Gastroenterology

## 2024-01-12 ENCOUNTER — Ambulatory Visit: Payer: Self-pay | Admitting: Gastroenterology

## 2024-01-12 ENCOUNTER — Ambulatory Visit (HOSPITAL_COMMUNITY)
Admission: RE | Admit: 2024-01-12 | Discharge: 2024-01-12 | Disposition: A | Discharge status: Home / Self Care | Source: Ambulatory Visit | Attending: Gastroenterology | Admitting: Gastroenterology

## 2024-01-12 VITALS — BP 147/98 | HR 91 | Temp 98.4°F | Ht 72.0 in | Wt 195.2 lb

## 2024-01-12 DIAGNOSIS — R112 Nausea with vomiting, unspecified: Secondary | ICD-10-CM | POA: Insufficient documentation

## 2024-01-12 DIAGNOSIS — R1011 Right upper quadrant pain: Secondary | ICD-10-CM | POA: Diagnosis not present

## 2024-01-12 DIAGNOSIS — R748 Abnormal levels of other serum enzymes: Secondary | ICD-10-CM | POA: Diagnosis not present

## 2024-01-12 DIAGNOSIS — F1090 Alcohol use, unspecified, uncomplicated: Secondary | ICD-10-CM | POA: Diagnosis not present

## 2024-01-12 DIAGNOSIS — R1114 Bilious vomiting: Secondary | ICD-10-CM

## 2024-01-12 DIAGNOSIS — R630 Anorexia: Secondary | ICD-10-CM

## 2024-01-12 LAB — CBC WITH DIFFERENTIAL/PLATELET
Abs Immature Granulocytes: 0.02 K/uL (ref 0.00–0.07)
Basophils Absolute: 0.1 K/uL (ref 0.0–0.1)
Basophils Relative: 1 %
Eosinophils Absolute: 0 K/uL (ref 0.0–0.5)
Eosinophils Relative: 0 %
HCT: 42.9 % (ref 39.0–52.0)
Hemoglobin: 15 g/dL (ref 13.0–17.0)
Immature Granulocytes: 0 %
Lymphocytes Relative: 16 %
Lymphs Abs: 1 K/uL (ref 0.7–4.0)
MCH: 31.3 pg (ref 26.0–34.0)
MCHC: 35 g/dL (ref 30.0–36.0)
MCV: 89.6 fL (ref 80.0–100.0)
Monocytes Absolute: 0.6 K/uL (ref 0.1–1.0)
Monocytes Relative: 10 %
Neutro Abs: 4.9 K/uL (ref 1.7–7.7)
Neutrophils Relative %: 73 %
Platelets: 389 K/uL (ref 150–400)
RBC: 4.79 MIL/uL (ref 4.22–5.81)
RDW: 12.2 % (ref 11.5–15.5)
WBC: 6.7 K/uL (ref 4.0–10.5)
nRBC: 0 % (ref 0.0–0.2)

## 2024-01-12 LAB — COMPREHENSIVE METABOLIC PANEL WITH GFR
ALT: 80 U/L — ABNORMAL HIGH (ref 0–44)
AST: 84 U/L — ABNORMAL HIGH (ref 15–41)
Albumin: 3.6 g/dL (ref 3.5–5.0)
Alkaline Phosphatase: 202 U/L — ABNORMAL HIGH (ref 38–126)
Anion gap: 12 (ref 5–15)
BUN: <5 mg/dL — ABNORMAL LOW (ref 6–20)
CO2: 28 mmol/L (ref 22–32)
Calcium: 8.9 mg/dL (ref 8.9–10.3)
Chloride: 95 mmol/L — ABNORMAL LOW (ref 98–111)
Creatinine, Ser: 0.97 mg/dL (ref 0.61–1.24)
GFR, Estimated: >60 mL/min (ref >60)
Glucose, Bld: 120 mg/dL — ABNORMAL HIGH (ref 70–99)
Potassium: 4.4 mmol/L (ref 3.5–5.1)
Sodium: 135 mmol/L (ref 135–145)
Total Bilirubin: 1.7 mg/dL — ABNORMAL HIGH (ref 0.0–1.2)
Total Protein: 7 g/dL (ref 6.5–8.1)

## 2024-01-12 LAB — PROTIME-INR
INR: 0.9 (ref 0.8–1.2)
Prothrombin Time: 13.1 s (ref 11.4–15.2)

## 2024-01-12 MED ORDER — ONDANSETRON 4 MG PO TBDP: 4.0000 mg | ORAL_TABLET | Freq: Three times a day (TID) | ORAL | 1 refills | Status: AC

## 2024-01-12 MED ORDER — SUCRALFATE 1 GM/10ML PO SUSP: 1.0000 g | Freq: Four times a day (QID) | ORAL | 1 refills | Status: AC

## 2024-01-12 NOTE — Patient Instructions (Signed)
 Please complete blood work at [facility]. I have also ordered a stat ultrasound.  For now: start taking Zofran  scheduled every 8 hours. Avoid all alcohol. Sip slowly on liquids a bit at  a time to avoid vomiting. I have sent in carafate  to take four times a day.   Further recommendations to follow!  It was a pleasure to see you today. I want to create trusting relationships with patients and provide genuine, compassionate, and quality care. I truly value your feedback, so please be on the lookout for a survey regarding your visit with me today. I appreciate your time in completing this!         Therisa MICAEL Stager, PhD, ANP-BC [facility]

## 2024-01-12 NOTE — Telephone Encounter (Signed)
 PA approved via carelon Order ID: [id number]       Authorized Approval Valid Through: 01/12/2024 - 03/11/2024

## 2024-01-12 NOTE — Progress Notes (Signed)
 Gastroenterology Office Note     Primary Care Physician:  Dow Longs, PA-C  Primary Gastroenterologist: Dr. Cindie    Chief Complaint   Chief Complaint  Patient presents with   Follow-up    Follow up after procedure and appetite. Pt needs Rx for nausea has not been taking his medications due to that reason     History of Present Illness   Eddie Bailey is a 48 y.o. male presenting today with a history of alcohol abuse, hepatic steatosis, elevated LFTs in setting of alcohol use,  RUQ abdominal pain, nausea, vomiting, decreased appetite, initially seen in consultation as outpatient at [facility] on 6/11 and felt to have biliary etiology, whereby he presented to the ED and underwent laparoscopic cholecystectomy on 6/12 with umbilical hernia repair and liver biopsy (steatohepatitis without fibrosis). He did well for 2 weeks post-operatively and now presenting with recurrent abdominal pain, nausea, and vomiting.   He has been [facility] ED on 7/15 with abdominal pain, whereby CT abd/pelvis showed unchanged ectasia of extrahepatic ducts (Does not give diameter), gallbladder absent, lipase normal, Tbili 0.6, AST 56, ALT 78, Alk Phos 205. No leukocytosis. Prior to cholecystectomy, AST 120, ALT 98, Alk phos 102. Slight increase in transaminases post-surgery with AST 138, ALT 109, Alk Phos 129, bili normal at 0.7.   Today he notes improvement post-surgery for about 2 weeks but then had recurrent RUQ abdominal pain, decreased appetite, persistent nausea and vomiting, gagging, globus sensation. Vomiting 2-3 times per ay. Feels tender right flank area as well. He has been unable to take Nexium daily as throws up pills. However, he does note he ate a piece of pizza yesterday. He is drinking about 3-4 large twisted ice teas daily. No overt GI bleeding but states stool is dark brown. Looser stool about twice per day. No fever. He has chills in the morning prior to vomiting but then resolved after vomiting. Urine is  dark yellow. Denies any jaundice or pruritus.   No prior EGD or colonoscopy.    RUQ US  6/11: CBD 5 mm  CT abd/pelvis with contrast in [facility] April 2025: CBD 7 mm    Past Medical History:  Diagnosis Date   Alcohol abuse    10 years   Anxiety    History of substance use    Hypertension     Past Surgical History:  Procedure Laterality Date   DIAGNOSTIC LAPAROSCOPIC LIVER BIOPSY  11/25/2023   Procedure: BIOPSY, LIVER, ROBOT-ASSISTED, LAPAROSCOPIC;  Surgeon: Kallie Manuelita BROCKS, MD;  Location: [facility];  Service: General;;   HERNIA REPAIR     inguinal   UMBILICAL HERNIA REPAIR  11/25/2023   Procedure: PRIMARY REPAIR, HERNIA, UMBILICAL, ADULT;  Surgeon: Kallie Manuelita BROCKS, MD;  Location: [facility];  Service: General;;    Current Outpatient Medications  Medication Sig Dispense Refill   Buprenorphine  HCl-Naloxone  HCl 8-2 MG FILM Place 1 Film under the tongue daily at 6 (six) AM. Do not restart until you follow up with pain management physician     busPIRone  (BUSPAR ) 10 MG tablet Take 10 mg by mouth 3 (three) times daily.     chlordiazePOXIDE  (LIBRIUM ) 25 MG capsule Take 1 capsule (25 mg total) by mouth 2 (two) times daily. 4 capsule 0   cloNIDine  (CATAPRES ) 0.1 MG tablet Take 0.1 mg by mouth daily.     divalproex  (DEPAKOTE ) 500 MG DR tablet Take 500 mg by mouth daily at 6 (six) AM.     esomeprazole (NEXIUM) 40 MG capsule  Take 40 mg by mouth every morning.     famotidine (PEPCID) 40 MG tablet Take 40 mg by mouth daily.     gabapentin  (NEURONTIN ) 300 MG capsule Take 300 mg by mouth 2 (two) times daily.     hydrOXYzine  (ATARAX ) 50 MG tablet Take 25 mg by mouth 3 (three) times daily as needed.     ibuprofen  (ADVIL ) 800 MG tablet Take 1 tablet (800 mg total) by mouth 3 (three) times daily. 21 tablet 0   No current facility-administered medications for this visit.    Allergies as of 01/12/2024   (No Known Allergies)    No family history on file.  Social History   Socioeconomic History    Marital status: Married    Spouse name: Not on file   Number of children: Not on file   Years of education: Not on file   Highest education level: Not on file  Occupational History   Not on file  Tobacco Use   Smoking status: Every Day    Current packs/day: 1.00    Types: Cigarettes   Smokeless tobacco: Never  Vaping Use   Vaping status: Never Used  Substance and Sexual Activity   Alcohol use: Yes    Alcohol/week: 12.0 standard drinks of alcohol    Types: 12 Cans of beer per week    Comment: daily   Drug use: Not Currently   Sexual activity: Yes  Other Topics Concern   Not on file  Social History Narrative   Not on file   Social Drivers of Health   Financial Resource Strain: Not on file  Food Insecurity: No Food Insecurity (11/28/2023)   Hunger Vital Sign    Worried About Running Out of Food in the Last Year: Never true    Ran Out of Food in the Last Year: Never true  Transportation Needs: No Transportation Needs (11/28/2023)   PRAPARE - Administrator, Civil Service (Medical): No    Lack of Transportation (Non-Medical): No  Physical Activity: Not on file  Stress: Not on file  Social Connections: Not on file  Intimate Partner Violence: Not At Risk (11/28/2023)   Humiliation, Afraid, Rape, and Kick questionnaire    Fear of Current or Ex-Partner: No    Emotionally Abused: No    Physically Abused: No    Sexually Abused: No     Review of Systems   Gen: Denies any fever, chills, fatigue, weight loss, lack of appetite.  CV: Denies chest pain, heart palpitations, peripheral edema, syncope.  Resp: Denies shortness of breath at rest or with exertion. Denies wheezing or cough.  GI: Denies dysphagia or odynophagia. Denies jaundice, hematemesis, fecal incontinence. GU : Denies urinary burning, urinary frequency, urinary hesitancy MS: Denies joint pain, muscle weakness, cramps, or limitation of movement.  Derm: Denies rash, itching, dry skin Psych: Denies  depression, anxiety, memory loss, and confusion Heme: Denies bruising, bleeding, and enlarged lymph nodes.   Physical Exam   BP (!) 147/98   Pulse 91   Temp 98.4 F (36.9 C)   Ht 6' (1.829 m)   Wt 195 lb 3.2 oz (88.5 kg)   BMI 26.47 kg/m  General:   Alert and oriented. Pleasant and cooperative. Appears uncomfortable Head:  Normocephalic and atraumatic. Eyes:  Without icterus Abdomen:  +BS, soft, TTP RUQ with slight palpation, no guarding Rectal:  Deferred  Msk:  Symmetrical without gross deformities. Normal posture. Extremities:  Without edema. Neurologic:  Alert and  oriented x4  Skin:  Intact without significant lesions or rashes. Psych:  Alert and cooperative. Normal mood and affect.   Assessment   Eddie Bailey is a 48 y.o. male presenting today with a history of alcohol abuse, hepatic steatosis, elevated LFTs in setting of alcohol use,  RUQ abdominal pain, nausea, vomiting, decreased appetite, initially seen in consultation as outpatient at [facility] on 6/11 and felt to have biliary etiology, whereby he was directed to the ED with concerns for acute cholecystitis and underwent laparoscopic cholecystectomy on 6/12 with umbilical hernia repair and liver biopsy (steatohepatitis without fibrosis). Returns today with recurrent abdominal pain, nausea, and vomiting.   RUQ abdominal pain: notably he did well for 2 weeks post-surgery then had recurrent symptoms, persistent vomiting daily, RUQ abdominal pain wrapping around to back, and recent ED presentation to [facility] with CT showing dilated extrahepatic ducts (unclear actual diameter), normal lipase, bump in alk phos to 205 but transaminases improved from  hospitalization and Tbili normal at 0.6. No leukocytosis noted. US  in June with CBD 5 mm and outside CT April 2025 with CBD 7 mm.   He continues to drink alcohol (3-4 large twisted tea cans a day) and was able to eat pizza yesterday but has persistent nausea and vomiting along with  abdominal pain. Need to rule out any choledocholithiasis, also unable to exclude acute alc hep in light of persistent alcohol use. May ultimately need EGD depending on findings. He is afebrile but does note chills associated with nausea.   I have requested stat RUQ US , labs to include CBC, CMP, lipase, INR.      PLAN  Stat CBC, CMP, lipase, INR RUQ US  abdomen today Zofran  scheduled TID sent to pharmacy Carafate  QID sent to pharmacy Resume Nexium if possible Absolute ETOH cessation IF labs unrevealing and ultrasound, needs EGD expeditiously If failure outpatient, needs to present to the ED Discussed in frank detail how continued drinking in setting of known steatohepatitis can result in acute alcoholic hepatitis, cirrhosis, increased morbidity, and mortality At some point will need elective screening colonoscopy after acute illness   Therisa MICAEL Stager, PhD, ANP-BC [facility]   Addendum on 7/31: US  abdomen with CBD upper limits of normal, Tbili 1/7, alk phos 202, AST 84, ALT 80. INR 0.9. MRI/MRCP then completed with CBD dilated to level of the ampulla and measuring up to 9 mm, no lesion or CBD stones. Marked hepatic steatosis.  Contacted patient. He still notes intermittent nausea and vomiting although able to eat a cheese quesadilla yesterday evening. He continues to drink and had 2 beers yesterday. Nausea is worse in mornings. RUQ pain is underlying but not worsened with eating. At this point, suspect has a degree of alcoholic hepatitis. With persistent N/V, arranging EGD next week with Dr. Shaaron as Dr. Cindie is away. Continue with PPI BID, continue carafate , continue zofran  scheduled.  Repeat HFP, INR on Friday.  To ED if unable to tolerate liquids or any changes.  Absolutely stop drinking alcohol. Therisa MICAEL Stager, PhD, ANP-BC [facility]

## 2024-01-13 ENCOUNTER — Encounter (HOSPITAL_COMMUNITY): Payer: Self-pay | Admitting: Gastroenterology

## 2024-01-13 ENCOUNTER — Ambulatory Visit (HOSPITAL_COMMUNITY)
Admission: RE | Admit: 2024-01-13 | Discharge: 2024-01-13 | Disposition: A | Discharge status: Home / Self Care | Source: Ambulatory Visit | Attending: Gastroenterology | Admitting: Gastroenterology

## 2024-01-13 ENCOUNTER — Other Ambulatory Visit (HOSPITAL_COMMUNITY): Payer: Self-pay | Admitting: Gastroenterology

## 2024-01-13 ENCOUNTER — Encounter: Payer: Self-pay | Admitting: *Deleted

## 2024-01-13 ENCOUNTER — Ambulatory Visit: Payer: Self-pay | Admitting: Gastroenterology

## 2024-01-13 DIAGNOSIS — R630 Anorexia: Secondary | ICD-10-CM | POA: Diagnosis present

## 2024-01-13 DIAGNOSIS — R1011 Right upper quadrant pain: Secondary | ICD-10-CM | POA: Insufficient documentation

## 2024-01-13 DIAGNOSIS — R112 Nausea with vomiting, unspecified: Secondary | ICD-10-CM | POA: Insufficient documentation

## 2024-01-13 DIAGNOSIS — R52 Pain, unspecified: Secondary | ICD-10-CM | POA: Diagnosis present

## 2024-01-13 DIAGNOSIS — R1114 Bilious vomiting: Secondary | ICD-10-CM | POA: Insufficient documentation

## 2024-01-13 MED ORDER — GADOBUTROL 1 MMOL/ML IV SOLN
9.0000 mL | Freq: Once | INTRAVENOUS | Status: AC | PRN
  Administered 2024-01-13: 9 mL via INTRAVENOUS

## 2024-01-18 ENCOUNTER — Encounter (HOSPITAL_COMMUNITY)
Admission: RE | Admit: 2024-01-18 | Discharge: 2024-01-18 | Disposition: A | Discharge status: Home / Self Care | Source: Ambulatory Visit | Attending: Internal Medicine | Admitting: Internal Medicine

## 2024-01-19 ENCOUNTER — Encounter (HOSPITAL_COMMUNITY): Payer: Self-pay | Admitting: Anesthesiology

## 2024-01-19 ENCOUNTER — Ambulatory Visit (HOSPITAL_COMMUNITY): Admission: RE | Admit: 2024-01-19 | Source: Home / Self Care | Admitting: Internal Medicine

## 2024-01-19 ENCOUNTER — Telehealth: Payer: Self-pay | Admitting: *Deleted

## 2024-01-19 ENCOUNTER — Encounter (HOSPITAL_COMMUNITY): Admission: RE | Payer: Self-pay | Source: Home / Self Care

## 2024-01-19 SURGERY — EGD (ESOPHAGOGASTRODUODENOSCOPY)
Anesthesia: Choice

## 2024-01-19 NOTE — Progress Notes (Signed)
 Patient no show EGD appointment today. Called patient x2 no answer.

## 2024-01-19 NOTE — Telephone Encounter (Signed)
 Eddie Bailey called from his primary physician's office and said he can not quit drinking and he is being admitted to a treatment facility today. I told him to call the office when he gets out to reschedule his procedure.

## 2024-01-21 NOTE — ED Provider Notes (Signed)
 Emergency Department Provider Note  Dragon voice dictation used for charting.    Provider at bedside: 2:02 AM  History obtained from the: Patient  History   Chief Complaint  Patient presents with  . Drug / Alcohol Assessment     HPI  Eddie Bailey is a 48 y.o. male with a PMH of opioid dependence on Suboxone , alcohol misuse disorder, and cholecystectomy who presents to the ED for detox from alcohol.  The patient states that he underwent cholecystectomy around 1 month ago after a few months of symptoms related to gallbladder dysfunction such as persistent nausea with vomiting and right upper quadrant abdominal pain.  For around 2 weeks after the cholecystectomy, patient states his symptoms improved, but then his previous symptoms returned.  He endorses diffuse abdominal pain that is worst in the right upper quadrant as well as persistent nausea with vomiting.  He states that is difficult to tolerate food due to his nausea, but he does continue to drink daily.  At this time, he states that he feels miserable but his abdominal pain is the same as it has been for the last 2 weeks or so.  He was set to undergo EGD, but states he was not able to stop drinking to perform the cleanout regimen prior to the procedure and came to the ED for detox. The patient does endorse around 2 months of daily alcohol use with his last drink occurring yesterday evening.  He states he drinks between 12-16 alcoholic beverages per day and typically drinks liquor and twisted tea.  Patient denies black stools or blood in the stool.  No hematemesis.  He denies any recreational drug use, but does endorse daily vaping.  At this time, he does not want a nicotine patch.  Patient also denies any SI/HI/AH/VH.  He is not aware as to any pertinent family medical history. No LMP for male patient.   Past Medical History Medical History[1]  Past Surgical History Surgical History[2]    Allergies Allergies[3]   Family  History Family History[4]   Social History Social History[5]    Physical Exam   Vitals:   01/25/24 1652 01/25/24 1715 01/25/24 1928 01/26/24 0028  BP: 119/85 113/76 116/84 112/83  BP Location: Left arm     Patient Position: Sitting  Sitting   Pulse: 92 90 95 103  Resp:  18 18   Temp:  97.9 F (36.6 C) 99.1 F (37.3 C)   TempSrc:  Oral Oral   SpO2:  97% 98%   Weight:      Height:        Physical Exam Vitals and nursing note reviewed.  Constitutional:      General: He is not in acute distress.    Appearance: He is well-developed. He is not toxic-appearing.  HENT:     Head: Normocephalic and atraumatic.     Right Ear: External ear normal.     Left Ear: External ear normal.   Eyes:     General: Lids are normal.     Conjunctiva/sclera: Conjunctivae normal.    Cardiovascular:     Rate and Rhythm: Normal rate and regular rhythm.     Pulses: Normal pulses.     Heart sounds: Normal heart sounds.  Pulmonary:     Effort: Pulmonary effort is normal.     Breath sounds: Normal breath sounds.  Abdominal:     General: Bowel sounds are normal. There is no distension.     Palpations: Abdomen is soft.  Tenderness: There is generalized abdominal tenderness. There is no guarding.     Comments: Recent surgical scars are well-approximated.  No surrounding cellulitic changes to indicate infection  Difuse abdominal ttp, worst in the RUQ   Neurological:     Mental Status: He is alert.   Psychiatric:        Mood and Affect: Mood normal.        Behavior: Behavior normal.     Labs   Lab Results (last 24 hours)     Procedure Component Value Ref Range Date/Time   Ammonia [[id number]]  (Normal) Collected: 01/25/24 1510   Lab Status: Final result Specimen: Blood from Venous Updated: 01/25/24 1546    Ammonia 47 18 - 72 umol/L    Magnesium [[id number]]  (Normal) Collected: 01/25/24 1510   Lab Status: Final result Specimen: Blood from Venous Updated: 01/25/24 1547     Magnesium 2.1 1.9 - 2.7 mg/dL          Radiology   Radiology Results (last 72 hours)     ** No results found for the last 72 hours. **        EKG     Encounter Date: 01/21/24  ECG 12 lead   Narrative   Ventricular Rate                   87        BPM                  Atrial Rate                        87        BPM                  P-R Interval                       118       ms                   QRS Duration                       90        ms                   Q-T Interval                       402       ms                   QTC Calculation Bazett             483       ms                   Calculated P Axis                  113       degrees              Calculated R Axis                  188       degrees              Calculated T Axis  174       degrees               Lead reversal suspected Sinus rhythm Biventricular enlargement Lateral infarct , age undetermined T wave abnormality, consider inferior ischemia  No previous ECGs available Confirmed by Leda Purchase  [id number]  on 01-22-2024 2 07 42 PM     ED Course   ED Course as of 01/26/24 0245  Fri Jan 21, 2024  0300 CBC, MP evaluated for leukocytosis, leukopenia, anemia, thrombocytopenia, hyponatremia, hypernatremia, hypokalemia, acidosis, acute kidney injury, hyperglycemia, hypoglycemia.  [AS]  [id number] The patient has been placed in psychiatric observation due to the need to provide a safe environment for the patient while obtaining psychiatric consultation and evaluation, as well as ongoing medical and medication management to treat the patient's condition.  The patient has not been placed under full IVC at this time.     [AS]  [name] [AS]    ED Course User Index [AS] Allyson J Schlagheck, PA-C    Procedure Note   Procedures  Medical Decision Making   Clinical Complexity  Patient's presentation is most consistent with exacerbation of chronic illness.    Provider time spent  in patient care today, inclusive of but not limited to clinical reassessment, review of diagnostic studies, and discharge preparation, was greater than 30 minutes.    Medical Decision Making Problems Addressed: Alcohol abuse: complicated acute illness or injury Chronic abdominal pain: complicated acute illness or injury  Amount and/or Complexity of Data Reviewed Labs: ordered. Decision-making details documented in ED Course. Radiology: ordered. Decision-making details documented in ED Course.  Risk OTC drugs. Prescription drug management. Decision regarding hospitalization.     Differential diagnosis includes but is not limited to DDX: Gastritis, GERD, peptic ulcer disease, colitis, gastroenteritis, appendicitis, small bowel obstruction, urinary tract infection, diverticulitis, pancreatitis, etoh abuse, etoh withdrawal   ED Clinical Impression   1. Alcohol abuse   2. Chronic abdominal pain      ED Assessment/Plan  Mr. Niswander to the ED with complaints of chronic abdominal pain and for detox from alcohol.  Upon arrival patient, was resting on the hospital bed in ED [facility] in no acute distress.  Physical exam was markable for the above findings.  Patient's medications were ordered.  CIWA protocols were ordered as well.  Due to nausea and withdrawal symptoms, patient was given Reglan 1 mg of Ativan  p.o.   CBC was unremarkable.  CMP was markabl remarkable for alk phos of 188, AST of 87, and ALT of 99.  When compared with previous levels, patient's LFTs are often elevated.  Total bilirubin is normal today.  Lipase is mildly elevated at 105.  UA is not consistent with a UTI and UDS is negative.  Patient serum ethanol was elevated at 82.  Patient discussed with assessment team ember Ford who recommends evaluation by psychiatry in the morning. There was no question as to whether the patient would likely need medical admission versus admission with psychiatry.  Concerning the patient's abdominal  pain, he states that it is at its baseline.  He is afebrile and has no leukocytosis, and is still able to tolerate some p.o. intake.  He recently underwent MRCP around 9 days ago and given that his symptoms have not significantly changed, I do not feel another CT scan at this time is completely necessary.  Patient was discussed with my attending Dr. Zackowski who stated we could obtain a right upper quadrant ultrasound for further evaluation.  Ultrasound showed increased liver parenchymal echogenicity which was noted to be nonspecific and could be seen with hepatic steatosis.  Overall, workup is reassuring.  At this point, patient is medically cleared.  Patient was signed out to Dr. Noralyn at shift change   ED Meds Given During Visit Medications  LORazepam  (ATIVAN ) tablet 1 mg (1 mg oral Given 01/24/24 1224)  LORazepam  (ATIVAN ) tablet 2 mg ( oral Canceled Entry 01/21/24 1749)    Or  diazePAM  (VALIUM ) injection 2.5 mg ( intramuscular See Alternative 01/21/24 1749)  cyanocobalamin (VITAMIN B12) injection 1,000 mcg (1,000 mcg intramuscular Given 01/21/24 0355)    Followed by  cyanocobalamin (VITAMIN B12) tablet 1,000 mcg (1,000 mcg oral Given 01/25/24 0811)  thiamine  (VITAMIN B1) injection 100 mg (100 mg intramuscular Given 01/21/24 0355)    Followed by  thiamine  (VITAMIN B1) tablet 100 mg (100 mg oral Given 01/25/24 0811)  cloNIDine  (CATAPRES ) tablet 0.1 mg (0.1 mg oral Given 01/25/24 2028)  divalproex  (DEPAKOTE  DR) 12 hr tablet 500 mg (500 mg oral Given 01/25/24 2028)  omeprazole (PriLOSEC) DR capsule 20 mg (20 mg oral Given 01/25/24 9364)  hydrOXYzine  (ATARAX ) tablet 50 mg (50 mg oral Given 01/25/24 9187)  ondansetron  (ZOFRAN -ODT) disintegrating tablet 4 mg (4 mg oral Given 01/22/24 0824)  buprenorphine -naloxone  (SUBOXONE ) 8-2 mg per sublingual tablet 1 tablet (1 tablet sublingual Given 01/25/24 0810)  ondansetron  (ZOFRAN -ODT) disintegrating tablet 4 mg (4 mg oral Given 01/26/24 0037)  ondansetron  (ZOFRAN )  injection 4 mg (has no administration in time range)  promethazine (PHENERGAN) suppository 25 mg (has no administration in time range)  haloperidol lactate (HALDOL) injection 5 mg (has no administration in time range)  haloperidol lactate (HALDOL) injection 10 mg (has no administration in time range)  nicotine polacrilex (NICORETTE) gum 2 mg (has no administration in time range)  dextrose (D50W) 50 % injection 12.5 g (has no administration in time range)  dextrose (GLUTOSE) 40 % oral gel 15 g (has no administration in time range)  loperamide (IMODIUM) capsule 4 mg (has no administration in time range)    Followed by  loperamide (IMODIUM) capsule 4 mg (has no administration in time range)  alum-mag hydroxide-simethicone (MAALOX, MYLANTA) 200-200-20 mg/5 mL suspension 30 mL (has no administration in time range)  albuterol HFA (PROVENTIL HFA;VENTOLIN HFA;PROAIR HFA) 90 mcg/actuation inhaler 2 puff (has no administration in time range)  cloNIDine  (CATAPRES ) tablet 0.1 mg (0.1 mg oral Given 01/25/24 0354)  traZODone (DESYREL) tablet 50 mg (50 mg oral Given 01/25/24 0006)  therapeutic multivitamin tablet 1 tablet (1 tablet oral Given 01/25/24 0811)  acetaminophen  (TYLENOL ) tablet 650 mg (650 mg oral Given 01/24/24 0808)  risperiDONE (RisperDAL M-TAB) disintegrating tablet 2 mg (has no administration in time range)  risperiDONE (RisperDAL M-TAB) disintegrating tablet 1 mg (has no administration in time range)  LORazepam  (ATIVAN ) tablet 1 mg (has no administration in time range)    Or  diazePAM  (VALIUM ) injection 5 mg (has no administration in time range)  diazePAM  (VALIUM ) injection 5 mg (has no administration in time range)  LORazepam  (ATIVAN ) tablet 1 mg (has no administration in time range)  busPIRone  (BUSPAR ) tablet 10 mg (10 mg oral Given 01/25/24 2122)  sertraline (ZOLOFT) tablet 50 mg (50 mg oral Given 01/25/24 0811)  propranoloL (INDERAL) tablet 10 mg (has no administration in time range)   LORazepam  (ATIVAN ) tablet 2 mg (2 mg oral Given 01/24/24 2113)    Followed by  LORazepam  (ATIVAN ) tablet 1 mg (1 mg oral Given 01/25/24 2122)  Followed by  LORazepam  (ATIVAN ) tablet 1 mg (has no administration in time range)    Followed by  LORazepam  (ATIVAN ) tablet 1 mg (has no administration in time range)  metoclopramide (REGLAN) tablet 10 mg (10 mg oral Given 01/21/24 0311)  LORazepam  (ATIVAN ) tablet 1 mg (1 mg oral Given 01/21/24 0301)  folic acid  (FOLVITE ) tablet 1 mg (1 mg oral Given 01/25/24 0811)  LORazepam  (ATIVAN ) tablet 2 mg (2 mg oral Given 01/22/24 9177)    Followed by  LORazepam  (ATIVAN ) tablet 2 mg (2 mg oral Given 01/23/24 9367)    Followed by  LORazepam  (ATIVAN ) tablet 1 mg (1 mg oral Given 01/24/24 9351)      Current Discharge Medication List        FOLLOW UP No follow-up provider specified.  Electronically signed by: 2:02 AM 01/26/2024 for Allyson Schlagheck PA-C        [1] Past Medical History: Diagnosis Date  . 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 DTs (delirium tremens)    (CMD) 11/27/2023   Hallucinations  . Elevated LFTs 01/12/2024  . Hepatic steatosis 01/12/2024  . Transaminitis 11/27/2023  [2] Past Surgical History: Procedure Laterality Date  . LAPAROSCOPIC CHOLECYSTECTOMY  11/25/2023  . UMBILICAL HERNIA REPAIR  11/25/2023  [3] No Known Allergies [4] No family history on file. [5] Social History Tobacco Use  . Smoking status: Former    Current packs/day: 0.00    Types: Cigarettes    Quit date: 06/15/2018    Years since quitting: 5.6  . Smokeless tobacco: Never  Substance Use Topics  . Alcohol use: Yes    Comment: Pt reports drinking 18-20 cans of Twisted Teas daily (5% ABV)  . Drug use: Not Currently

## 2024-01-24 NOTE — Telephone Encounter (Signed)
 Noted
